# Patient Record
Sex: Male | Born: 1940 | Race: White | Hispanic: No | Marital: Married | Smoking: Former smoker
Health system: Southern US, Community
[De-identification: ages and names within clinical notes are randomized; demographics above are authoritative.]

## PROBLEM LIST (undated history)

## (undated) DIAGNOSIS — R001 Bradycardia, unspecified: Secondary | ICD-10-CM

## (undated) DIAGNOSIS — I509 Heart failure, unspecified: Secondary | ICD-10-CM

## (undated) DIAGNOSIS — H3530 Unspecified macular degeneration: Secondary | ICD-10-CM

## (undated) DIAGNOSIS — R413 Other amnesia: Secondary | ICD-10-CM

## (undated) DIAGNOSIS — R479 Unspecified speech disturbances: Secondary | ICD-10-CM

## (undated) DIAGNOSIS — R269 Unspecified abnormalities of gait and mobility: Secondary | ICD-10-CM

## (undated) HISTORY — PX: ROTATOR CUFF REPAIR: SHX139

## (undated) HISTORY — DX: Bradycardia, unspecified: R00.1

## (undated) HISTORY — PX: NOSE SURGERY: SHX723

## (undated) HISTORY — DX: Heart failure, unspecified: I50.9

## (undated) HISTORY — DX: Other amnesia: R41.3

## (undated) HISTORY — PX: OTHER SURGICAL HISTORY: SHX169

## (undated) HISTORY — DX: Unspecified abnormalities of gait and mobility: R26.9

## (undated) HISTORY — PX: FACIAL RECONSTRUCTION SURGERY: SHX631

## (undated) HISTORY — DX: Unspecified speech disturbances: R47.9

## (undated) HISTORY — DX: Unspecified macular degeneration: H35.30

---

## 2007-03-03 HISTORY — PX: CARDIOVASCULAR STRESS TEST: SHX262

## 2007-03-04 ENCOUNTER — Encounter: Admission: RE | Admit: 2007-03-04 | Discharge: 2007-03-04 | Payer: Self-pay | Admitting: Family Medicine

## 2007-03-11 ENCOUNTER — Inpatient Hospital Stay (HOSPITAL_BASED_OUTPATIENT_CLINIC_OR_DEPARTMENT_OTHER): Admission: RE | Admit: 2007-03-11 | Discharge: 2007-03-11 | Payer: Self-pay | Admitting: Cardiovascular Disease

## 2007-03-11 HISTORY — PX: CARDIAC CATHETERIZATION: SHX172

## 2008-12-20 HISTORY — PX: US ECHOCARDIOGRAPHY: HXRAD669

## 2009-12-29 ENCOUNTER — Ambulatory Visit: Payer: Self-pay | Admitting: Cardiovascular Disease

## 2010-08-07 NOTE — Cardiovascular Report (Signed)
NAMEDIRCK, BUTCH NO.:  [id number]   MEDICAL RECORD NO.:  [id number]          PATIENT TYPE:  OIB   LOCATION:  1962                         FACILITY:  [facility]   PHYSICIAN:  Vesta Mixer, M.D. DATE OF BIRTH:  November 22, 1940   DATE OF PROCEDURE:  03/11/2007  DATE OF DISCHARGE:                            CARDIAC CATHETERIZATION   Tommy Ross is an elderly gentleman with a history of bradycardia and  mild-to-moderate left ventricular dysfunction.  He was referred for  heart catheterization after having an abnormal Cardiolite study.   PROCEDURE:  Left heart catheterization with coronary angiography and  aortic aortography.   The right femoral artery was easily cannulated using modified Seldinger  technique.   HEMODYNAMICS:  The LV pressure is 107/11 with an aortic pressure of  106/79.   ANGIOGRAPHY:  Left main:  The left main is fairly smooth and normal.   The left anterior descending artery is a moderate-sized to large-sized  vessel.  It is smooth and normal throughout its course.  The first  diagonal artery is normal.   The left circumflex artery is relatively small.  It has a small first  obtuse marginal artery, and the circumflex and the marginals are normal.   The right coronary artery is a fairly large and dominant vessel.  It  gives off several small right ventricular branches and then has a  moderate-sized posterior descending artery and a moderate-sized  posterolateral artery, both of which are normal.   The left ventriculogram was performed in a 30 RAO position.  It reveals  a mildly dilated left ventricle.  Ejection fraction appears to be at the  lower limits of normal.   The aortogram was performed in a 30 LAO projection.  It reveals a trace  amount of aortic insufficiency.  There is mild-to-moderate aortic root  dilatation.   COMPLICATIONS:  None.   CONCLUSION:  1. Smooth and normal coronary arteries.  2. Low normal left ventricular  systolic function.  3. 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  4. Mild aortic insufficiency.   We will continue with medical therapy.           ______________________________  Vesta Mixer, M.D.     PJN/MEDQ  D:  03/11/2007  T:  03/11/2007  Job:  [id number]   cc:   Ursula Beath, MD

## 2010-08-07 NOTE — H&P (Signed)
NAMEOSBY, SWEETIN NO.:  [id number]   MEDICAL RECORD NO.:  [id number]           PATIENT TYPE:   LOCATION:                                 FACILITY:   PHYSICIAN:  Vesta Mixer, M.D. DATE OF BIRTH:  [birth year]   DATE OF ADMISSION:  03/11/2007  DATE OF DISCHARGE:                              HISTORY & PHYSICAL   Tommy Ross is a 70 year old gentleman with a history of bradycardia.  He is admitted now for heart catheterization after having an abnormal  stress Cardiolite study.   Tommy Ross has been a relatively healthy middle-aged gentleman.  He  exercises on a regular basis.  He has been noted to have bradycardia.  He has not noticed any episodes of chest pain or shortness of breath,  but has had some episodes of lightheadedness.  He, otherwise, does not  have any complaints.  He denies any real syncope or presyncope.  He  denies any PND or orthopnea.   He recently had a stress Cardiolite study during which it was very  difficult for him to get his heart rate up to his prescribed amount.  He  did not have any evidence of ischemia, but did have a moderately  depressed left ventricular systolic function with an ejection fraction  of 38%.  He is now referred for heart catheterization for further  evaluation of these problems.   CURRENT MEDICATIONS:  Multivitamin once a day, fiber once a day,  glucosamine once a day.   ALLERGIES:  None.   PAST MEDICAL HISTORY:  History of bradycardia, history of rotator cuff  surgery.   SOCIAL HISTORY:  The patient is a retired Financial controller at [employer].  He exercises  three times a week at the [facility].  He used to smoke, but quit in 1977.   FAMILY HISTORY:  His father died at age 49 due to suicide.  His mother  died at age 49 due to Alzheimer's disease.   REVIEW OF SYSTEMS:  Was reviewed and is essentially negative except for  as noted in the HPI.   PHYSICAL EXAMINATION:  GENERAL:  He is a middle-aged gentleman in no  acute  distress.  He is alert and oriented x3, and his mood and affect  are normal.  VITAL SIGNS:  Weight 224, blood pressure 134/80 with heart rate of 62.  NECK:  Reveals 2+ carotids.  He has no bruits, no JVD, no thyromegaly.  LUNGS:  Clear to auscultation.  HEART:  Regular rate, S1, S2.  ABDOMEN:  Reveals good bowel sounds and is nontender.  EXTREMITIES:  He has no clubbing, cyanosis or edema.  NEUROLOGICAL:  Nonfocal.   Tommy Ross presents with bradycardia and a moderately depressed left  ventricular systolic function.  I have recommended that we proceed with  heart catheterization for further evaluation.  We have discussed the  risks, benefits, and options of heart catheterization.  He understands  and agrees to proceed.  We will schedule it for December 17.           ______________________________  Vesta Mixer, M.D.  PJN/MEDQ  D:  03/04/2007  T:  03/05/2007  Job:  [id number]   cc:   Ursula Beath, MD

## 2010-10-08 ENCOUNTER — Other Ambulatory Visit: Payer: Self-pay | Admitting: Cardiology

## 2010-10-08 NOTE — Telephone Encounter (Signed)
Wrote on script needs yearly ov.

## 2011-01-15 ENCOUNTER — Other Ambulatory Visit: Payer: Self-pay | Admitting: Cardiovascular Disease

## 2011-01-15 NOTE — Telephone Encounter (Signed)
Fax Received. Refill Completed. Tommy Ross (M.A)  

## 2011-01-30 ENCOUNTER — Encounter: Payer: Self-pay | Admitting: Cardiovascular Disease

## 2011-02-01 ENCOUNTER — Encounter: Payer: Self-pay | Admitting: Cardiovascular Disease

## 2011-02-01 ENCOUNTER — Ambulatory Visit (INDEPENDENT_AMBULATORY_CARE_PROVIDER_SITE_OTHER): Payer: Medicare Other | Admitting: Cardiovascular Disease

## 2011-02-01 VITALS — BP 119/74 | HR 60 | Ht 70.5 in | Wt 236.8 lb

## 2011-02-01 DIAGNOSIS — I509 Heart failure, unspecified: Secondary | ICD-10-CM

## 2011-02-01 MED ORDER — LISINOPRIL 10 MG PO TABS: 10.0000 mg | ORAL_TABLET | Freq: Every day | ORAL | Status: DC

## 2011-02-01 NOTE — Progress Notes (Signed)
Tommy Ross Date of Birth  1940/10/13 Tommy Ross [address]    [address] [place], [place]  [address] [phone]  Fax  [phone]  History of Present Illness:  Tommy Ross is a 70 y.o. gentleman with a hx of CHF.  He remains quite active. He's not had any episodes of chest pain or shortness of breath.  Current Outpatient Prescriptions on File Prior to Visit  Medication Sig Dispense Refill  . colchicine 0.6 MG tablet Take 0.6 mg by mouth as needed.        Marland Kitchen FIBER PO Take by mouth daily.        Marland Kitchen lisinopril (PRINIVIL,ZESTRIL) 10 MG tablet TAKE 1 TABLET EVERY DAY  30 tablet  0  . Multiple Vitamin (MULTIVITAMIN) tablet Take 1 tablet by mouth daily.        . Multiple Vitamins-Minerals (EYE VITAMINS PO) Take by mouth daily.          No Known Allergies  Past Medical History  Diagnosis Date  . CHF (congestive heart failure)     EF 45-50%  . Bradycardia     Past Surgical History  Procedure Date  . Rotator cuff repair   . Nose surgery   . US echocardiography 12/20/2008    EF 45-50%  . Cardiovascular stress test 03/03/2007    EF 38%. NO EVIDENCE OF ISCHEMIA  . Cardiac catheterization 03/11/2007    REVEALS A MILDLY DILATED LEFT VENTRICLE. EF LOWER LIMITS OF NORMAL. SMOOTH AND NORMAL CORONARY ARTERIES. MILD AORTIC INSUFFICIENCY    History  Smoking status  . Former Smoker  . Quit date: 03/26/1975  Smokeless tobacco  . Not on file    History  Alcohol Use No    Family History  Problem Relation Age of Onset  . Alzheimer's disease Mother   . Suicidality Father   . Diabetes Father     Reviw of Systems:  Reviewed in the HPI.  All other systems are negative.  Physical Exam: BP 119/74  Pulse 60  Ht 5' 10.5" (1.791 m)  Wt 236 lb 12.8 oz (107.412 kg)  BMI 33.50 kg/m2 The patient is alert and oriented x 3.  The mood and affect are normal.   Skin: warm and dry.  Color is normal.    HEENT:   Normocephalic, atraumatic. He is no JVD. His carotids are  normal.  Lungs: His lungs are clear.   Heart: Regular rate S1-S2.  No murmurs    Abdomen: His abdomen is soft. He has good bowel sounds.  Extremities:  No clubbing cyanosis or edema.  Neuro:  Exam is nonfocal. His gait is normal.    ECG: Normal sinus rhythm. He has no ST or T wave changes.  Assessment / Plan:

## 2011-02-01 NOTE — Patient Instructions (Signed)
Your physician wants you to follow-up in: 1 year  You will receive a reminder letter in the mail two months in advance. If you don't receive a letter, please call our office to schedule the follow-up appointment.   Your physician recommends that you return for a FASTING lipid profile: 1 year   

## 2011-02-01 NOTE — Assessment & Plan Note (Signed)
Tommy Ross remains on ACE inhibitor. He is not on a beta blocker because of his bradycardia. Will continue the same medications. See him again in 1 year.

## 2012-01-28 ENCOUNTER — Other Ambulatory Visit: Payer: Self-pay | Admitting: *Deleted

## 2012-01-28 MED ORDER — LISINOPRIL 10 MG PO TABS: 10.0000 mg | ORAL_TABLET | Freq: Every day | ORAL | Status: DC

## 2012-01-28 NOTE — Telephone Encounter (Signed)
Pt needs appointment then refill can be made Fax Received. Refill Completed. Edelmira Gallogly Chowoe (R.M.A)   

## 2012-05-06 ENCOUNTER — Other Ambulatory Visit: Payer: Self-pay | Admitting: *Deleted

## 2012-05-06 MED ORDER — LISINOPRIL 10 MG PO TABS: 10.0000 mg | ORAL_TABLET | Freq: Every day | ORAL | Status: DC

## 2012-05-06 NOTE — Telephone Encounter (Signed)
Spoke to patient in reguard to filling his medication and explained to patient that our policy would like for patient to come in to see Dr. Elease Hashimoto at least once a year (last seen 2012). pati

## 2012-05-06 NOTE — Telephone Encounter (Signed)
NEED APPOINTMENT

## 2012-05-14 ENCOUNTER — Other Ambulatory Visit: Payer: Self-pay | Admitting: *Deleted

## 2012-05-14 NOTE — Telephone Encounter (Signed)
NEED APPOINTMENT Fax Received. Refill Completed. Louana Fontenot Chowoe (R.M.A)   

## 2012-06-04 ENCOUNTER — Other Ambulatory Visit: Payer: Self-pay | Admitting: *Deleted

## 2012-06-04 MED ORDER — LISINOPRIL 10 MG PO TABS: 10.0000 mg | ORAL_TABLET | Freq: Every day | ORAL | Status: DC

## 2012-06-04 NOTE — Telephone Encounter (Signed)
Fax Received. Refill Completed. Tommy Ross (R.M.A)  No refills until appointment

## 2012-07-27 ENCOUNTER — Encounter: Payer: Self-pay | Admitting: Cardiovascular Disease

## 2012-08-12 ENCOUNTER — Ambulatory Visit (INDEPENDENT_AMBULATORY_CARE_PROVIDER_SITE_OTHER): Payer: Medicare Other | Admitting: Cardiovascular Disease

## 2012-08-12 ENCOUNTER — Encounter: Payer: Self-pay | Admitting: Cardiovascular Disease

## 2012-08-12 VITALS — BP 134/76 | HR 59 | Ht 70.5 in | Wt 236.8 lb

## 2012-08-12 DIAGNOSIS — I509 Heart failure, unspecified: Secondary | ICD-10-CM

## 2012-08-12 LAB — BASIC METABOLIC PANEL
Chloride: 110 mEq/L (ref 96–112)
Creatinine, Ser: 1.3 mg/dL (ref 0.4–1.5)
GFR: 58.92 mL/min — ABNORMAL LOW (ref >60.00)
Glucose, Bld: 86 mg/dL (ref 70–99)

## 2012-08-12 LAB — HEPATIC FUNCTION PANEL
Alkaline Phosphatase: 85 U/L (ref 39–117)
Bilirubin, Direct: 0 mg/dL (ref 0.0–0.3)
Total Bilirubin: 0.8 mg/dL (ref 0.3–1.2)
Total Protein: 6.9 g/dL (ref 6.0–8.3)

## 2012-08-12 LAB — LIPID PANEL
Cholesterol: 211 mg/dL — ABNORMAL HIGH (ref 0–200)
Total CHOL/HDL Ratio: 5
Triglycerides: 199 mg/dL — ABNORMAL HIGH (ref 0.0–149.0)
VLDL: 39.8 mg/dL (ref 0.0–40.0)

## 2012-08-12 LAB — LDL CHOLESTEROL, DIRECT: Direct LDL: 141.6 mg/dL

## 2012-08-12 MED ORDER — LISINOPRIL 10 MG PO TABS: 10.0000 mg | ORAL_TABLET | Freq: Every day | ORAL | Status: DC

## 2012-08-12 NOTE — Progress Notes (Signed)
Tommy Ross Date of Birth  11-21-1940 [facility] [address]    [address] [place], [place]  [address] [phone]  Fax  [phone]  History of Present Illness:  Tommy Ross is a 72 y.o. gentleman with a hx of CHF.  He remains quite active. He's not had any episodes of chest pain or shortness of breath.  Aug 12, 2012:  Tommy Ross is doing great.  He is exercising regularly.  Cutting lots of firewood.  [facility]    Current Outpatient Prescriptions on File Prior to Visit  Medication Sig Dispense Refill  . colchicine 0.6 MG tablet Take 0.6 mg by mouth as needed.        Marland Kitchen FIBER PO Take by mouth daily.        Marland Kitchen lisinopril (PRINIVIL,ZESTRIL) 10 MG tablet Take 1 tablet (10 mg total) by mouth daily.  30 tablet  2  . Multiple Vitamin (MULTIVITAMIN) tablet Take 1 tablet by mouth daily.        . Multiple Vitamins-Minerals (EYE VITAMINS PO) Take by mouth daily.        . NON FORMULARY Black Cherry Extract        No current facility-administered medications on file prior to visit.    No Known Allergies  Past Medical History  Diagnosis Date  . CHF (congestive heart failure)     EF 45-50%  . Bradycardia     Past Surgical History  Procedure Laterality Date  . Rotator cuff repair    . Nose surgery    . US echocardiography  12/20/2008    EF 45-50%  . Cardiovascular stress test  03/03/2007    EF 38%. NO EVIDENCE OF ISCHEMIA  . Cardiac catheterization  03/11/2007    REVEALS A MILDLY DILATED LEFT VENTRICLE. EF LOWER LIMITS OF NORMAL. SMOOTH AND NORMAL CORONARY ARTERIES. MILD AORTIC INSUFFICIENCY    History  Smoking status  . Former Smoker  . Quit date: 03/26/1975  Smokeless tobacco  . Not on file    History  Alcohol Use No    Family History  Problem Relation Age of Onset  . Alzheimer's disease Mother   . Suicidality Father   . Diabetes Father     Reviw of Systems:  Reviewed in the HPI.  All other systems are negative.  Physical Exam: BP  134/76  Pulse 59  Ht 5' 10.5" (1.791 m)  Wt 236 lb 12.8 oz (107.412 kg)  BMI 33.49 kg/m2  SpO2 94% The patient is alert and oriented x 3.  The mood and affect are normal.   Skin: warm and dry.  Color is normal.    HEENT:   Normocephalic, atraumatic. He is no JVD. His carotids are normal.  Lungs: His lungs are clear.   Heart: Regular rate S1-S2.  No murmurs    Abdomen: His abdomen is soft. He has good bowel sounds.  Extremities:  No clubbing cyanosis or edema.  Neuro:  Exam is nonfocal. His gait is normal.    ECG: Normal sinus rhythm. He has no ST or T wave changes.  Assessment / Plan:

## 2012-08-12 NOTE — Patient Instructions (Addendum)
Your physician wants you to follow-up in: 1 year with EKG  You will receive a reminder letter in the mail two months in advance. If you don't receive a letter, please call our office to schedule the follow-up appointment.  Your physician recommends that you return for lab work in: today bmet lipid hepatic  RESTART LISINOPRIL, SCRIPT SENT TO PHARMACY

## 2012-08-12 NOTE — Assessment & Plan Note (Addendum)
Tommy Ross is doing well.   He ran out of his lisinopril recently. He seems to be doing well. He is avoiding salt for the most part.  I will see him  again in one year for followup office visit.

## 2013-07-24 ENCOUNTER — Other Ambulatory Visit: Payer: Self-pay | Admitting: Cardiovascular Disease

## 2014-01-25 ENCOUNTER — Other Ambulatory Visit: Payer: Self-pay | Admitting: Cardiovascular Disease

## 2014-01-27 ENCOUNTER — Other Ambulatory Visit: Payer: Self-pay | Admitting: Cardiovascular Disease

## 2014-02-26 ENCOUNTER — Other Ambulatory Visit: Payer: Self-pay | Admitting: Cardiovascular Disease

## 2014-02-26 NOTE — Telephone Encounter (Signed)
Rx was sent to pharmacy electronically. 

## 2014-03-11 ENCOUNTER — Other Ambulatory Visit: Payer: Self-pay | Admitting: Cardiovascular Disease

## 2014-03-18 ENCOUNTER — Other Ambulatory Visit: Payer: Self-pay | Admitting: Cardiovascular Disease

## 2014-03-21 NOTE — Telephone Encounter (Signed)
May refill. Please make him an apt.

## 2014-03-21 NOTE — Telephone Encounter (Signed)
This is Nahser's patient not mine

## 2014-03-22 ENCOUNTER — Other Ambulatory Visit: Payer: Self-pay

## 2014-03-22 MED ORDER — LISINOPRIL 10 MG PO TABS: ORAL_TABLET | ORAL | Status: DC

## 2014-06-28 ENCOUNTER — Emergency Department (HOSPITAL_COMMUNITY)
Admission: EM | Admit: 2014-06-28 | Discharge: 2014-06-28 | Disposition: A | Discharge status: Home / Self Care | Payer: Medicare Other | Attending: Emergency Medicine | Admitting: Emergency Medicine

## 2014-06-28 ENCOUNTER — Encounter (HOSPITAL_COMMUNITY): Payer: Self-pay | Admitting: Emergency Medicine

## 2014-06-28 ENCOUNTER — Emergency Department (HOSPITAL_COMMUNITY): Payer: Medicare Other

## 2014-06-28 DIAGNOSIS — Y92009 Unspecified place in unspecified non-institutional (private) residence as the place of occurrence of the external cause: Secondary | ICD-10-CM | POA: Insufficient documentation

## 2014-06-28 DIAGNOSIS — Z23 Encounter for immunization: Secondary | ICD-10-CM | POA: Insufficient documentation

## 2014-06-28 DIAGNOSIS — J342 Deviated nasal septum: Secondary | ICD-10-CM

## 2014-06-28 DIAGNOSIS — S022XXB Fracture of nasal bones, initial encounter for open fracture: Secondary | ICD-10-CM | POA: Insufficient documentation

## 2014-06-28 DIAGNOSIS — Y998 Other external cause status: Secondary | ICD-10-CM | POA: Diagnosis not present

## 2014-06-28 DIAGNOSIS — S0121XA Laceration without foreign body of nose, initial encounter: Secondary | ICD-10-CM | POA: Insufficient documentation

## 2014-06-28 DIAGNOSIS — S0181XA Laceration without foreign body of other part of head, initial encounter: Secondary | ICD-10-CM

## 2014-06-28 DIAGNOSIS — W010XXA Fall on same level from slipping, tripping and stumbling without subsequent striking against object, initial encounter: Secondary | ICD-10-CM | POA: Insufficient documentation

## 2014-06-28 DIAGNOSIS — W19XXXA Unspecified fall, initial encounter: Secondary | ICD-10-CM

## 2014-06-28 DIAGNOSIS — Y9389 Activity, other specified: Secondary | ICD-10-CM | POA: Insufficient documentation

## 2014-06-28 DIAGNOSIS — S0992XA Unspecified injury of nose, initial encounter: Secondary | ICD-10-CM | POA: Diagnosis present

## 2014-06-28 MED ORDER — HYDROCODONE-ACETAMINOPHEN 5-325 MG PO TABS: ORAL_TABLET | ORAL | Status: DC

## 2014-06-28 MED ORDER — CEFAZOLIN SODIUM 1-5 GM-% IV SOLN
1.0000 g | Freq: Once | INTRAVENOUS | Status: DC
  Filled 2014-06-28: qty 50

## 2014-06-28 MED ORDER — BACITRACIN 500 UNIT/GM EX OINT
3.0000 "application " | TOPICAL_OINTMENT | Freq: Once | CUTANEOUS | Status: AC
  Administered 2014-06-28: 3 via TOPICAL
  Filled 2014-06-28: qty 2.7

## 2014-06-28 MED ORDER — TETANUS-DIPHTH-ACELL PERTUSSIS 5-2.5-18.5 LF-MCG/0.5 IM SUSP
0.5000 mL | Freq: Once | INTRAMUSCULAR | Status: AC
  Administered 2014-06-28: 0.5 mL via INTRAMUSCULAR
  Filled 2014-06-28: qty 0.5

## 2014-06-28 MED ORDER — LIDOCAINE-EPINEPHRINE-TETRACAINE (LET) SOLUTION
3.0000 mL | Freq: Once | NASAL | Status: AC
  Administered 2014-06-28: 3 mL via TOPICAL
  Filled 2014-06-28: qty 3

## 2014-06-28 MED ORDER — AMOXICILLIN-POT CLAVULANATE 875-125 MG PO TABS
1.0000 | ORAL_TABLET | Freq: Once | ORAL | Status: AC
  Administered 2014-06-28: 1 via ORAL
  Filled 2014-06-28: qty 1

## 2014-06-28 MED ORDER — LIDOCAINE HCL (PF) 1 % IJ SOLN
5.0000 mL | Freq: Once | INTRAMUSCULAR | Status: AC
  Administered 2014-06-28: 5 mL
  Filled 2014-06-28: qty 5

## 2014-06-28 MED ORDER — AMOXICILLIN-POT CLAVULANATE 875-125 MG PO TABS: 1.0000 | ORAL_TABLET | Freq: Two times a day (BID) | ORAL | Status: DC

## 2014-06-28 NOTE — ED Notes (Signed)
Pt st's he tripped over a root in the yard and fell.  Pt has deep lac to bridge of nose.  Also lac to right forehead with lg hematoma

## 2014-06-28 NOTE — ED Provider Notes (Signed)
CSN: [id number]     Arrival date & time 06/28/14  1711 History  This chart was scribed for non-physician practitioner, Wynetta EmeryNicole Tajuanna Burnett, PA-C, working with Gilda Creasehristopher J Pollina, MD, by Bronson CurbJacqueline Melvin, ED Scribe. This patient was seen in room TR02C/TR02C and the patient's care was started at 6:05 PM.    Chief Complaint  Patient presents with  . Facial Laceration     The history is provided by the patient. No language interpreter was used.     HPI Comments: Tommy Ross is a 74 y.o. male who presents to the Emergency Department complaining of facial lacerations sustained today. Patient was gardening when he tripped over a root and fell forward onto the ground. There is a laceration to the nasal bridge and an additional laceration on the right forehead. He denies anticoagulation, LOC, changes in vision, vomiting, neck pain, weakness, chest pain, abdominal pain, or abnormal gait. Bleeding is easy to control. He was seen at Urgent Care and sent to the ED for further evaluation. Last tetanus unknown. Patient has history of remote facial trauma that was managed by Dr. Lazarus SalinesWolicki   Past Medical History  Diagnosis Date  . CHF (congestive heart failure)     EF 45-50%  . Bradycardia    Past Surgical History  Procedure Laterality Date  . Rotator cuff repair    . Nose surgery    . [place] echocardiography  12/20/2008    EF 45-50%  . Cardiovascular stress test  03/03/2007    EF 38%. NO EVIDENCE OF ISCHEMIA  . Cardiac catheterization  03/11/2007    REVEALS A MILDLY DILATED LEFT VENTRICLE. EF LOWER LIMITS OF NORMAL. SMOOTH AND NORMAL CORONARY ARTERIES. MILD AORTIC INSUFFICIENCY   Family History  Problem Relation Age of Onset  . Alzheimer's disease Mother   . Suicidality Father   . Diabetes Father    History  Substance Use Topics  . Smoking status: Former Smoker    Quit date: 03/26/1975  . Smokeless tobacco: Not on file  . Alcohol Use: No    Review of Systems  A complete 10 system review of  systems was obtained and all systems are negative except as noted in the HPI and PMH.    Allergies  Review of patient's allergies indicates no known allergies.  Home Medications   Prior to Admission medications   Medication Sig Start Date End Date Taking? Authorizing Provider  ACETAMINOPHEN PO Take by mouth daily.    Historical Provider, MD  colchicine 0.6 MG tablet Take 0.6 mg by mouth as needed.      Historical Provider, MD  FIBER PO Take by mouth daily.      Historical Provider, MD  GLUCOSAMINE CHONDROITIN COMPLX PO Take by mouth daily.    Historical Provider, MD  lisinopril (PRINIVIL,ZESTRIL) 10 MG tablet TAKE 1 TABLET (10 MG TOTAL) BY MOUTH DAILY. <PLEASE MAKE AN APPOINTMENT FOR FUTURE REFILLS> 03/22/14   Vesta MixerPhilip J Nahser, MD  Multiple Vitamin (MULTIVITAMIN) tablet Take 1 tablet by mouth daily.      Historical Provider, MD  Multiple Vitamins-Minerals (EYE VITAMINS PO) Take by mouth daily.      Historical Provider, MD  NON FORMULARY Black Cherry Extract     Historical Provider, MD   Triage Vitals: BP 137/75 mmHg  Pulse 58  Temp(Src) 98 F (36.7 C) (Oral)  Resp 20  Ht 5\' 11"  (1.803 m)  Wt 230 lb (104.327 kg)  BMI 32.09 kg/m2  SpO2 96%  Physical Exam  Constitutional: He is oriented  to person, place, and time. He appears well-developed and well-nourished. No distress.  HENT:  Head: Normocephalic.  Mouth/Throat: Oropharynx is clear and moist.  Patient has partial-thickness abrasions to forehead and right zygoma.   2 cm irregular laceration that is full-thickness over the nasal bridge, there is significant swelling and tenderness. Scant venous bleeding, no contamination of the wound.   There is blood in the right nares, there is no septal hematoma, septum appears deviated to the left.  Extraocular movement is intact without pain or diplopia, there is no crepitance or tenderness to palpation along the orbital rims. There is no intraoral trauma, loose or painful teeth.  No  hemotympanum, battle signs or raccoon's eyes  No abnormal otorrhea  Eyes: Conjunctivae and EOM are normal. Pupils are equal, round, and reactive to light.  Neck: Normal range of motion. Neck supple. No tracheal deviation present.  No midline C-spine  tenderness to palpation or step-offs appreciated. Patient has full range of motion without pain.   Cardiovascular: Normal rate, regular rhythm and intact distal pulses.   Pulmonary/Chest: Effort normal and breath sounds normal. No respiratory distress. He has no wheezes. He has no rales. He exhibits no tenderness.  No  TTP or crepitance  Abdominal: Soft. Bowel sounds are normal. He exhibits no distension and no mass. There is no tenderness. There is no rebound and no guarding.  Musculoskeletal: Normal range of motion. He exhibits no edema or tenderness.  Pelvis stable. No deformity or TTP of major joints.   Good ROM  Neurological: He is alert and oriented to person, place, and time.  Follows commands, Clear, goal oriented speech, Strength is 5 out of 5x4 extremities, patient ambulates with a coordinated in nonantalgic gait. Sensation is grossly intact.   Skin: Skin is warm and dry.  Psychiatric: He has a normal mood and affect. His behavior is normal.  Nursing note and vitals reviewed.   ED Course  LACERATION REPAIR Date/Time: 06/29/2014 1:10 AM Performed by: Wynetta Emery Authorized by: Wynetta Emery Consent: Verbal consent obtained. Consent given by: patient Patient identity confirmed: verbally with patient Body area: head/neck Laceration length: 2 cm Foreign bodies: no foreign bodies Anesthesia: local infiltration Local anesthetic: lidocaine 1% without epinephrine and LET (lido,epi,tetracaine) Anesthetic total: 4 ml Patient sedated: no Preparation: Patient was prepped and draped in the usual sterile fashion. Irrigation solution: saline Irrigation method: syringe Amount of cleaning: extensive Debridement: minimal Degree  of undermining: none Skin closure: Ethilon (5-0) Number of sutures: 4 Technique: simple Approximation: close Approximation difficulty: simple Dressing: antibiotic ointment Patient tolerance: Patient tolerated the procedure well with no immediate complications   (including critical care time)  DIAGNOSTIC STUDIES: Oxygen Saturation is 96% on room air, adequate by my interpretation.    COORDINATION OF CARE: At 1805 Discussed treatment plan with patient which includes imaging. Patient agrees.    Labs Review Labs Reviewed - No data to display  Imaging Review Ct Head Wo Contrast  06/28/2014   CLINICAL DATA:  Fall over tree root with facial injury and laceration. Frontal bone and nasal pain.  EXAM: CT HEAD WITHOUT CONTRAST  CT MAXILLOFACIAL WITHOUT CONTRAST  CT CERVICAL SPINE WITHOUT CONTRAST  TECHNIQUE: Multidetector CT imaging of the head, cervical spine, and maxillofacial structures were performed using the standard protocol without intravenous contrast. Multiplanar CT image reconstructions of the cervical spine and maxillofacial structures were also generated.  COMPARISON:  None.  FINDINGS: CT HEAD FINDINGS  Skull and Sinuses:Facial findings are described below. No acute calvarial injury.  Orbits: No acute abnormality.  Brain: No evidence of acute infarction, hemorrhage, hydrocephalus, or mass lesion/mass effect. Marked brain atrophy, especially prominent to the cerebellum.  CT MAXILLOFACIAL FINDINGS  Soft tissue swelling and lacerations over the nasal bridge. There is expansion and fat haziness over the frontal bone as well. Although this could be partially postsurgical, given contiguous contusion and laceration it is considered an acute forehead contusion  Sclerosis and chronic appearing fracture lines to the bilateral nasal arch, where there is also sutures. There is superimposed acute injury with a fracture of the right nasal bone that is depressed. The anterior nasal septum is buckled with  small volume mucosal emphysema, compatible with acute fracture and laceration. No related nasal cavity narrowing.  Status post frontal sinus obliteration. There is no expansion suggestive of a mucocele. Inflammatory mucosal thickening extends into the frontal ethmoidal recesses. The mandible is intact and located. No acute findings in the orbits.  CT CERVICAL SPINE FINDINGS  Negative for cervical spine fracture. No suspected traumatic malalignment. Slight anterolisthesis at C2-3 is associated with advanced right facet arthropathy, the most likely explanation. Degenerative disc disease with focally advanced disc narrowing at C3-4 and C4-5. No gross cervical canal hematoma or prevertebral edema.  IMPRESSION: 1. No evidence of acute intracranial injury. 2. 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 There is overlying laceration concerning for open injury. 3. Minimally displaced nasal septum fracture with mucosal laceration. 4. Negative for cervical spine fracture. 5. Cerebellar predominant brain atrophy.   Electronically Signed   By: Marnee Spring M.D.   On: 06/28/2014 20:11   Ct Cervical Spine Wo Contrast  06/28/2014   CLINICAL DATA:  Fall over tree root with facial injury and laceration. Frontal bone and nasal pain.  EXAM: CT HEAD WITHOUT CONTRAST  CT MAXILLOFACIAL WITHOUT CONTRAST  CT CERVICAL SPINE WITHOUT CONTRAST  TECHNIQUE: Multidetector CT imaging of the head, cervical spine, and maxillofacial structures were performed using the standard protocol without intravenous contrast. Multiplanar CT image reconstructions of the cervical spine and maxillofacial structures were also generated.  COMPARISON:  None.  FINDINGS: CT HEAD FINDINGS  Skull and Sinuses:Facial findings are described below. No acute calvarial injury.  Orbits: No acute abnormality.  Brain: No evidence of acute infarction, hemorrhage, hydrocephalus, or mass lesion/mass effect. Marked brain atrophy, especially prominent to the  cerebellum.  CT MAXILLOFACIAL FINDINGS  Soft tissue swelling and lacerations over the nasal bridge. There is expansion and fat haziness over the frontal bone as well. Although this could be partially postsurgical, given contiguous contusion and laceration it is considered an acute forehead contusion  Sclerosis and chronic appearing fracture lines to the bilateral nasal arch, where there is also sutures. There is superimposed acute injury with a fracture of the right nasal bone that is depressed. The anterior nasal septum is buckled with small volume mucosal emphysema, compatible with acute fracture and laceration. No related nasal cavity narrowing.  Status post frontal sinus obliteration. There is no expansion suggestive of a mucocele. Inflammatory mucosal thickening extends into the frontal ethmoidal recesses. The mandible is intact and located. No acute findings in the orbits.  CT CERVICAL SPINE FINDINGS  Negative for cervical spine fracture. No suspected traumatic malalignment. Slight anterolisthesis at C2-3 is associated with advanced right facet arthropathy, the most likely explanation. Degenerative disc disease with focally advanced disc narrowing at C3-4 and C4-5. No gross cervical canal hematoma or prevertebral edema.  IMPRESSION: 1. No evidence of acute intracranial injury. 2. Acute on chronic nasal arch  fractures with depression. There is overlying laceration concerning for open injury. 3. Minimally displaced nasal septum fracture with mucosal laceration. 4. Negative for cervical spine fracture. 5. Cerebellar predominant brain atrophy.   Electronically Signed   By: Marnee Spring M.D.   On: 06/28/2014 20:11   Ct Maxillofacial Wo Cm  06/28/2014   CLINICAL DATA:  Fall over tree root with facial injury and laceration. Frontal bone and nasal pain.  EXAM: CT HEAD WITHOUT CONTRAST  CT MAXILLOFACIAL WITHOUT CONTRAST  CT CERVICAL SPINE WITHOUT CONTRAST  TECHNIQUE: Multidetector CT imaging of the head, cervical  spine, and maxillofacial structures were performed using the standard protocol without intravenous contrast. Multiplanar CT image reconstructions of the cervical spine and maxillofacial structures were also generated.  COMPARISON:  None.  FINDINGS: CT HEAD FINDINGS  Skull and Sinuses:Facial findings are described below. No acute calvarial injury.  Orbits: No acute abnormality.  Brain: No evidence of acute infarction, hemorrhage, hydrocephalus, or mass lesion/mass effect. Marked brain atrophy, especially prominent to the cerebellum.  CT MAXILLOFACIAL FINDINGS  Soft tissue swelling and lacerations over the nasal bridge. There is expansion and fat haziness over the frontal bone as well. Although this could be partially postsurgical, given contiguous contusion and laceration it is considered an acute forehead contusion  Sclerosis and chronic appearing fracture lines to the bilateral nasal arch, where there is also sutures. There is superimposed acute injury with a fracture of the right nasal bone that is depressed. The anterior nasal septum is buckled with small volume mucosal emphysema, compatible with acute fracture and laceration. No related nasal cavity narrowing.  Status post frontal sinus obliteration. There is no expansion suggestive of a mucocele. Inflammatory mucosal thickening extends into the frontal ethmoidal recesses. The mandible is intact and located. No acute findings in the orbits.  CT CERVICAL SPINE FINDINGS  Negative for cervical spine fracture. No suspected traumatic malalignment. Slight anterolisthesis at C2-3 is associated with advanced right facet arthropathy, the most likely explanation. Degenerative disc disease with focally advanced disc narrowing at C3-4 and C4-5. No gross cervical canal hematoma or prevertebral edema.  IMPRESSION: 1. No evidence of acute intracranial injury. 2. 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 There is overlying laceration concerning for open injury. 3.  Minimally displaced nasal septum fracture with mucosal laceration. 4. Negative for cervical spine fracture. 5. Cerebellar predominant brain atrophy.   Electronically Signed   By: Marnee Spring M.D.   On: 06/28/2014 20:11     EKG Interpretation None      MDM   Final diagnoses:  Face lacerations, initial encounter  Nasal bone fracture, open, initial encounter  Deviated nasal septum  Fall at home, initial encounter    MDM Number of Diagnoses or Management Options Deviated nasal septum:  Face lacerations, initial encounter:  Fall at home, initial encounter:  Nasal bone fracture, open, initial encounter:    Filed Vitals:   06/28/14 1755 06/28/14 2123  BP: 137/75 137/69  Pulse: 58 65  Temp: 98 F (36.7 C) 98.2 F (36.8 C)  TempSrc: Oral   Resp: 20 20  Height:  (1.803 m)   Weight: 230 lb (104.327 kg)   SpO2: 96% 95%    Medications  Tdap (BOOSTRIX) injection 0.5 mL (0.5 mLs Intramuscular Given 06/28/14 1815)  lidocaine (PF) (XYLOCAINE) 1 % injection 5 mL (5 mLs Other Given 06/28/14 1828)  lidocaine-EPINEPHrine-tetracaine (LET) solution (3 mLs Topical Given 06/28/14 1817)  bacitracin ointment 3 application (3 application Topical Given 06/28/14 2113)  amoxicillin-clavulanate (AUGMENTIN) 875-125 MG per tablet 1 tablet (1 tablet Oral Given 06/28/14 2122)    Stepan Verrette is a pleasant 74 y.o. male presenting with laceration and facial trauma status post mechanical slip and fall. Nasal bridge is extremely swollen, no signs of orbital fracture or entrapment. Considering his age head CT, maxillofacial CT and C-spine CT are ordered. Maxillofacial CT shows acute on chronic nasal arch fractures with depression overlying laceration, minimally displaced nasal septum and fracture with mucosal laceration.  Maxillofacial consult from Dr. Suszanne Conners appreciated: States that IV Ancef is not necessary, he recommends Augmentin. He states it is appropriate to close the wound in the ED. He will evaluate  the patient in the office at the end of the week after the swelling has gone down. Discussed wound care and return precautions.   Discussed case with attending MD who agrees with plan and stability to d/c to home.   Evaluation does not show pathology that would require ongoing emergent intervention or inpatient treatment. Pt is hemodynamically stable and mentating appropriately. Discussed findings and plan with patient/guardian, who agrees with care plan. All questions answered. Return precautions discussed and outpatient follow up given.    Wynetta Emery, PA-C 06/29/14 0115  Gilda Crease, MD 07/01/14 [phone]

## 2014-06-28 NOTE — Discharge Instructions (Signed)
Rest, Ice intermittently (in the first 24-48 hours), Gentle compression with an Ace wrap, and elevate (Limb above the level of the heart)   Take vicodin for breakthrough pain, do not drink alcohol, drive, care for children or do other critical tasks while taking vicodin.  Keep wound dry and do not remove dressing for 24 hours if possible. After that, wash gently morning and night (every 12 hours) with soap and water. Use a topical antibiotic ointment and cover with a bandaid or gauze.    Do NOT use rubbing alcohol or hydrogen peroxide, do not soak the area   Present to your primary care doctor or the urgent care of your choice, or the ED for suture removal in 7-10 days.   Every attempt was made to remove foreign body (contaminants) from the wound.  However, there is always a chance that some may remain in the wound. This can  increase your risk of infection.   If you see signs of infection (warmth, redness, tenderness, pus, sharp increase in pain, fever, red streaking in the skin) immediately return to the emergency department.   After the wound heals fully, apply sunscreen for 6-12 months to minimize scarring.    Facial Fracture A facial fracture is a break in one of the bones of your face. HOME CARE INSTRUCTIONS   Protect the injured part of your face until it is healed.  Do not participate in activities which give chance for re-injury until your doctor approves.  Gently wash and dry your face.  Wear head and facial protection while riding a bicycle, motorcycle, or snowmobile. SEEK MEDICAL CARE IF:   An oral temperature above 102 F (38.9 C) develops.  You have severe headaches or notice changes in your vision.  You have new numbness or tingling in your face.  You develop nausea (feeling sick to your stomach), vomiting or a stiff neck. SEEK IMMEDIATE MEDICAL CARE IF:   You develop difficulty seeing or experience double vision.  You become dizzy, lightheaded, or  faint.  You develop trouble speaking, breathing, or swallowing.  You have a watery discharge from your nose or ear. MAKE SURE YOU:   Understand these instructions.  Will watch your condition.  Will get help right away if you are not doing well or get worse. Document Released: 03/11/2005 Document Revised: 06/03/2011 Document Reviewed: 10/29/2007 [facility] Patient Information 2015 [place], [place]. This information is not intended to replace advice given to you by your health care provider. Make sure you discuss any questions you have with your health care provider.  Facial Laceration  A facial laceration is a cut on the face. These injuries can be painful and cause bleeding. Lacerations usually heal quickly, but they need special care to reduce scarring. DIAGNOSIS  Your health care provider will take a medical history, ask for details about how the injury occurred, and examine the wound to determine how deep the cut is. TREATMENT  Some facial lacerations may not require closure. Others may not be able to be closed because of an increased risk of infection. The risk of infection and the chance for successful closure will depend on various factors, including the amount of time since the injury occurred. The wound may be cleaned to help prevent infection. If closure is appropriate, pain medicines may be given if needed. Your health care provider will use stitches (sutures), wound glue (adhesive), or skin adhesive strips to repair the laceration. These tools bring the skin edges together to allow for faster  healing and a better cosmetic outcome. If needed, you may also be given a tetanus shot. HOME CARE INSTRUCTIONS  Only take over-the-counter or prescription medicines as directed by your health care provider.  Follow your health care provider's instructions for wound care. These instructions will vary depending on the technique used for closing the wound. For Sutures:  Keep the wound clean and  dry.   If you were given a bandage (dressing), you should change it at least once a day. Also change the dressing if it becomes wet or dirty, or as directed by your health care provider.   Wash the wound with soap and water 2 times a day. Rinse the wound off with water to remove all soap. Pat the wound dry with a clean towel.   After cleaning, apply a thin layer of the antibiotic ointment recommended by your health care provider. This will help prevent infection and keep the dressing from sticking.   You may shower as usual after the first 24 hours. Do not soak the wound in water until the sutures are removed.   Get your sutures removed as directed by your health care provider. With facial lacerations, sutures should usually be taken out after 4-5 days to avoid stitch marks.   Wait a few days after your sutures are removed before applying any makeup. For Skin Adhesive Strips:  Keep the wound clean and dry.   Do not get the skin adhesive strips wet. You may bathe carefully, using caution to keep the wound dry.   If the wound gets wet, pat it dry with a clean towel.   Skin adhesive strips will fall off on their own. You may trim the strips as the wound heals. Do not remove skin adhesive strips that are still stuck to the wound. They will fall off in time.  For Wound Adhesive:  You may briefly wet your wound in the shower or bath. Do not soak or scrub the wound. Do not swim. Avoid periods of heavy sweating until the skin adhesive has fallen off on its own. After showering or bathing, gently pat the wound dry with a clean towel.   Do not apply liquid medicine, cream medicine, ointment medicine, or makeup to your wound while the skin adhesive is in place. This may loosen the film before your wound is healed.   If a dressing is placed over the wound, be careful not to apply tape directly over the skin adhesive. This may cause the adhesive to be pulled off before the wound is healed.    Avoid prolonged exposure to sunlight or tanning lamps while the skin adhesive is in place.  The skin adhesive will usually remain in place for 5-10 days, then naturally fall off the skin. Do not pick at the adhesive film.  After Healing: Once the wound has healed, cover the wound with sunscreen during the day for 1 full year. This can help minimize scarring. Exposure to ultraviolet light in the first year will darken the scar. It can take 1-2 years for the scar to lose its redness and to heal completely.  SEEK IMMEDIATE MEDICAL CARE IF:  You have redness, pain, or swelling around the wound.   You see ayellowish-white fluid (pus) coming from the wound.   You have chills or a fever.  MAKE SURE YOU:  Understand these instructions.  Will watch your condition.  Will get help right away if you are not doing well or get worse. Document Released: 04/18/2004 Document  Revised: 12/30/2012 Document Reviewed: 10/22/2012 [facility] Patient Information 2015 [place], [place]. This information is not intended to replace advice given to you by your health care provider. Make sure you discuss any questions you have with your health care provider.

## 2020-04-18 ENCOUNTER — Ambulatory Visit: Payer: Medicare Other | Admitting: Neurology

## 2020-06-19 ENCOUNTER — Other Ambulatory Visit: Payer: Self-pay

## 2020-06-19 ENCOUNTER — Ambulatory Visit: Payer: Medicare Other | Admitting: Neurology

## 2020-06-19 ENCOUNTER — Encounter: Payer: Self-pay | Admitting: Neurology

## 2020-06-19 VITALS — BP 132/82 | HR 67

## 2020-06-19 DIAGNOSIS — G3184 Mild cognitive impairment, so stated: Secondary | ICD-10-CM | POA: Insufficient documentation

## 2020-06-19 DIAGNOSIS — R270 Ataxia, unspecified: Secondary | ICD-10-CM

## 2020-06-19 DIAGNOSIS — R269 Unspecified abnormalities of gait and mobility: Secondary | ICD-10-CM | POA: Diagnosis not present

## 2020-06-19 MED ORDER — DIVALPROEX SODIUM ER 500 MG PO TB24: 500.0000 mg | ORAL_TABLET | Freq: Every day | ORAL | 11 refills | Status: AC
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
## 2020-06-20 ENCOUNTER — Telehealth: Payer: Self-pay | Admitting: Neurology

## 2020-06-20 ENCOUNTER — Telehealth: Payer: Self-pay | Admitting: *Deleted

## 2020-06-20 LAB — CBC WITH DIFFERENTIAL/PLATELET
Basophils Absolute: 0 10*3/uL (ref 0.0–0.2)
Basos: 0 %
EOS (ABSOLUTE): 0.1 10*3/uL (ref 0.0–0.4)
Eos: 2 %
Hematocrit: 44.1 % (ref 37.5–51.0)
Hemoglobin: 14.9 g/dL (ref 13.0–17.7)
Immature Grans (Abs): 0 10*3/uL (ref 0.0–0.1)
Immature Granulocytes: 0 %
Lymphocytes Absolute: 2 10*3/uL (ref 0.7–3.1)
Lymphs: 26 %
MCH: 28.7 pg (ref 26.6–33.0)
MCHC: 33.8 g/dL (ref 31.5–35.7)
MCV: 85 fL (ref 79–97)
Monocytes Absolute: 0.8 10*3/uL (ref 0.1–0.9)
Monocytes: 11 %
Neutrophils Absolute: 4.7 10*3/uL (ref 1.4–7.0)
Neutrophils: 61 %
Platelets: 241 10*3/uL (ref 150–450)
RBC: 5.19 x10E6/uL (ref 4.14–5.80)
RDW: 13.3 % (ref 11.6–15.4)
WBC: 7.7 10*3/uL (ref 3.4–10.8)

## 2020-06-20 LAB — COMPREHENSIVE METABOLIC PANEL
ALT: 7 IU/L (ref 0–44)
AST: 20 IU/L (ref 0–40)
Albumin/Globulin Ratio: 2 (ref 1.2–2.2)
Albumin: 4.5 g/dL (ref 3.7–4.7)
Alkaline Phosphatase: 117 IU/L (ref 44–121)
BUN/Creatinine Ratio: 16 (ref 10–24)
BUN: 18 mg/dL (ref 8–27)
Bilirubin Total: 0.7 mg/dL (ref 0.0–1.2)
CO2: 23 mmol/L (ref 20–29)
Calcium: 9.4 mg/dL (ref 8.6–10.2)
Chloride: 101 mmol/L (ref 96–106)
Creatinine, Ser: 1.11 mg/dL (ref 0.76–1.27)
Globulin, Total: 2.3 g/dL (ref 1.5–4.5)
Glucose: 95 mg/dL (ref 65–99)
Potassium: 3.9 mmol/L (ref 3.5–5.2)
Sodium: 141 mmol/L (ref 134–144)
Total Protein: 6.8 g/dL (ref 6.0–8.5)
eGFR: 68 mL/min/{1.73_m2} (ref >59)

## 2020-06-20 LAB — HIV ANTIBODY (ROUTINE TESTING W REFLEX): HIV Screen 4th Generation wRfx: NONREACTIVE

## 2020-06-20 LAB — FOLATE: Folate: >20 ng/mL (ref >3.0)

## 2020-06-20 LAB — RPR: RPR Ser Ql: NONREACTIVE

## 2020-06-20 LAB — TSH: TSH: 0.806 u[IU]/mL (ref 0.450–4.500)

## 2020-06-20 LAB — C-REACTIVE PROTEIN: CRP: 1 mg/L (ref 0–10)

## 2020-06-20 LAB — VITAMIN B12: Vitamin B-12: 859 pg/mL (ref 232–1245)

## 2020-06-20 NOTE — Telephone Encounter (Signed)
[employer] medicare order sent to [facility]. No auth they will reach out to the patient to schedule.  

## 2020-06-20 NOTE — Telephone Encounter (Signed)
Left a detailed message, on daughter's voicemail, with results (ok per DPR).  Provided our number to call back with any questions.

## 2020-06-20 NOTE — Telephone Encounter (Signed)
-----   Message from Levert Feinstein, MD sent at 06/20/2020  2:41 PM EDT ----- Please call patient for normal laboratory result

## 2020-06-28 ENCOUNTER — Telehealth: Payer: Self-pay | Admitting: Neurology

## 2020-06-28 NOTE — Telephone Encounter (Addendum)
His EEG appt has been cancelled.

## 2020-06-28 NOTE — Telephone Encounter (Signed)
I returned the call to the patient's daughter. She wanted to review his recent neurological evaluation. She had questions about the necessity of Depakote, EEG and CT head. She wanted possible diagnosis and potential treatments. She was wary about the side effects of Depakote. She would prefer him not to undergo tests that would not change his treatment (due to his age). She would like a call from Dr. Terrace Arabia for further discussion.

## 2020-06-28 NOTE — Telephone Encounter (Addendum)
I talked with his daughter Lurena Joiner, answered all her related questions, she reported gradual decline of Mr.Zenk functional status over the past few years, word finding difficulties, getting frustrated easily, but there is no clear clinical episode to suggest a seizure,  Lurena Joiner is worried whether he can go through the EEG testing, will cancel the order,  He will try Depakote ER 500 mg, for his agitation, Lurena Joiner will call back if he has improvement,

## 2020-06-28 NOTE — Telephone Encounter (Signed)
Pt's daughter Lurena Joiner on [place] called wanting to discuss the pt's EEG appt. Please advise.

## 2020-07-17 ENCOUNTER — Other Ambulatory Visit: Payer: Medicare Other

## 2021-06-13 ENCOUNTER — Emergency Department (HOSPITAL_COMMUNITY)
Admission: EM | Admit: 2021-06-13 | Discharge: 2021-06-14 | Disposition: A | Discharge status: Home / Self Care | Payer: Medicare Other | Attending: Emergency Medicine | Admitting: Emergency Medicine

## 2021-06-13 ENCOUNTER — Encounter (HOSPITAL_COMMUNITY): Payer: Self-pay | Admitting: Emergency Medicine

## 2021-06-13 ENCOUNTER — Other Ambulatory Visit: Payer: Self-pay

## 2021-06-13 ENCOUNTER — Emergency Department (HOSPITAL_COMMUNITY): Payer: Medicare Other

## 2021-06-13 DIAGNOSIS — W19XXXA Unspecified fall, initial encounter: Secondary | ICD-10-CM | POA: Diagnosis not present

## 2021-06-13 DIAGNOSIS — Y92009 Unspecified place in unspecified non-institutional (private) residence as the place of occurrence of the external cause: Secondary | ICD-10-CM | POA: Diagnosis not present

## 2021-06-13 DIAGNOSIS — R296 Repeated falls: Secondary | ICD-10-CM | POA: Insufficient documentation

## 2021-06-13 DIAGNOSIS — S0990XA Unspecified injury of head, initial encounter: Secondary | ICD-10-CM | POA: Diagnosis not present

## 2021-06-13 NOTE — ED Notes (Signed)
Pt wanded by security at this time  ?

## 2021-06-13 NOTE — ED Triage Notes (Signed)
Pt BIB [facility] post fall x 2; reports mental status at baseline-prior TBI; shoes noted to be on the wrong feet; pt c/o no pain; wife reported to EMS she is unsure if pt hit his head and wants him evaluated ?

## 2021-06-13 NOTE — ED Provider Notes (Signed)
? ?[facility] EMERGENCY DEPARTMENT  ?Provider Note ? ?CSN: [id number] ?Arrival date & time: 06/13/21 2152 ? ?History ?Chief Complaint  ?Patient presents with  ? Fall  ? ? ?Tommy Ross is a 81 y.o. male with prior history of TBI brought to the ED by EMS from home. Daughter at bedside provides much of history. Patient has frequent falls but had two back-to-back today and wife was unable to get him up off the floor. He is usually able to get up on his own so this was unusual. He was noted to have his shoes on the wrong feet at arrival but it is unclear if this may have contributed. He denies any complaints. He is not on blood thinners. He is on sinemet but does not have a formal diagnosis of parkinson's in his chart.  ? ? ?Home Medications ?Prior to Admission medications   ?Medication Sig Start Date End Date Taking? Authorizing Provider  ?acetaminophen (TYLENOL) 650 MG CR tablet Take 650 mg by mouth daily.    [provider]  ?carbidopa-levodopa (SINEMET IR) 10-100 MG tablet Take 1 tablet by mouth in the morning, at noon, and at bedtime. 12/06/19   [provider]  ?co-enzyme Q-10 30 MG capsule Take 1 capsule by mouth daily.    [provider]  ?cyanocobalamin 1000 MCG tablet Take 1 tablet by mouth daily.    [provider]  ?divalproex (DEPAKOTE ER) 500 MG 24 hr tablet Take 1 tablet (500 mg total) by mouth at bedtime. 06/19/20   Levert Feinstein, MD  ?Multiple Vitamin (MULTIVITAMIN) tablet Take 1 tablet by mouth daily.    [provider]  ?UNABLE TO FIND Take 1 tablet by mouth daily. Med Name: Prostate supplement    [provider]  ? ? ? ?Allergies    ?Other and Neosporin [bacitracin-polymyxin b] ? ? ?Review of Systems   ?Review of Systems ?Please see HPI for pertinent positives and negatives ? ?Physical Exam ?BP 137/77   Pulse 64   Temp 97.7 ?F (36.5 ?C) (Oral)   Resp (!) 21   Ht 5\' 11"  (1.803 m)   Wt 104.3 kg   SpO2 97%   BMI 32.08 kg/m?  ? ?Physical Exam ?Vitals  and nursing note reviewed.  ?Constitutional:   ?   Appearance: Normal appearance.  ?HENT:  ?   Head: Normocephalic and atraumatic.  ?   Nose: Nose normal.  ?   Mouth/Throat:  ?   Mouth: Mucous membranes are moist.  ?Eyes:  ?   Extraocular Movements: Extraocular movements intact.  ?   Conjunctiva/sclera: Conjunctivae normal.  ?Cardiovascular:  ?   Rate and Rhythm: Normal rate.  ?Pulmonary:  ?   Effort: Pulmonary effort is normal.  ?   Breath sounds: Normal breath sounds.  ?Abdominal:  ?   General: Abdomen is flat.  ?   Palpations: Abdomen is soft.  ?   Tenderness: There is no abdominal tenderness.  ?Musculoskeletal:     ?   General: No swelling or tenderness. Normal range of motion.  ?   Cervical back: Neck supple.  ?Skin: ?   General: Skin is warm and dry.  ?Neurological:  ?   Mental Status: He is alert. Mental status is at baseline. He is disoriented.  ?   Cranial Nerves: No cranial nerve deficit.  ?   Sensory: No sensory deficit.  ?   Motor: No weakness.  ?Psychiatric:     ?   Mood and Affect: Mood normal.  ? ? ?  ED Results / Procedures / Treatments   ?EKG ?EKG Interpretation ? ?Date/Time:  Wednesday June 13 2021 23:36:18 EDT ?Ventricular Rate:  58 ?PR Interval:  203 ?QRS Duration: 104 ?QT Interval:  420 ?QTC Calculation: 413 ?R Axis:   46 ?Text Interpretation: Sinus rhythm Low voltage, extremity leads No old tracing to compare Confirmed by Susy Frizzle [phone]) on 06/13/2021 11:38:15 PM ? ?Procedures ?Procedures ? ?Medications Ordered in the ED ?Medications  ?LORazepam (ATIVAN) injection 1 mg (1 mg Intramuscular Given 06/14/21 0051)  ? ? ?Initial Impression and Plan ? Patient here with recurrent falls and weakness/unable to get up on his own. Will check basic labs, CT head for possible head injury as daughter is unsure if he hit his head or not.  ? ?ED Course  ? ?Clinical Course as of 06/14/21 0301  ?Wed Jun 13, 2021  ?2338 I personally viewed the images from radiology studies and agree with radiologist  interpretation: CT neg for injury ? [CS]  ?Thu Jun 14, 2021  ?0032 CBC CMP and CK are normal.  [CS]  ?2563 Depakote is borderline subtherapeutic. He has been incontinent of urine and became agitated and aggressive with ED staff and daughter while they were trying to change him. Will give IM Ativan to help with agitation and for staff/family safety while completing his ED workup.  [CS]  ?8937 Patient resting comfortably now with daughter. Discussed normal workup thus far. He has not yet provided a urine specimen but given his incontinence earlier and agitation with changing his pants, I do not feel that we will be able to get a sample safely tonight. I have a low suspicion this is the cause of his falls. She is comfortable taking him home. Recommend outpatient follow up. Probably needs to be managed with neurology as well but she indicated he was not cooperative with that assessment when attempted in the past.  [CS]  ?  ?Clinical Course User Index ?[CS] Pollyann Savoy, MD  ? ? ? ?MDM Rules/Calculators/A&P ?Medical Decision Making ?Given presenting complaint, I considered that admission might be necessary. After review of results from ED lab and/or imaging studies, admission to the hospital is not indicated at this time.  ? ? ?Problems Addressed: ?Fall in home, initial encounter: acute illness or injury ? ?Amount and/or Complexity of Data Reviewed ?Labs: ordered. ?Radiology: ordered. ? ?Risk ?Prescription drug management. ?Decision regarding hospitalization. ? ? ? ?Final Clinical Impression(s) / ED Diagnoses ?Final diagnoses:  ?Fall in home, initial encounter  ? ? ?Rx / DC Orders ?ED Discharge Orders   ? ? None  ? ?  ? ?  ?Pollyann Savoy, MD ?06/14/21 0301 ? ?

## 2021-06-14 ENCOUNTER — Telehealth: Payer: Self-pay | Admitting: Neurology

## 2021-06-14 LAB — COMPREHENSIVE METABOLIC PANEL
ALT: 20 U/L (ref 0–44)
AST: 27 U/L (ref 15–41)
Albumin: 4 g/dL (ref 3.5–5.0)
Alkaline Phosphatase: 80 U/L (ref 38–126)
Anion gap: 12 (ref 5–15)
BUN: 24 mg/dL — ABNORMAL HIGH (ref 8–23)
CO2: 26 mmol/L (ref 22–32)
Calcium: 8.9 mg/dL (ref 8.9–10.3)
Chloride: 105 mmol/L (ref 98–111)
Creatinine, Ser: 1.09 mg/dL (ref 0.61–1.24)
GFR, Estimated: >60 mL/min (ref >60)
Glucose, Bld: 98 mg/dL (ref 70–99)
Potassium: 3.3 mmol/L — ABNORMAL LOW (ref 3.5–5.1)
Sodium: 143 mmol/L (ref 135–145)
Total Bilirubin: 1.1 mg/dL (ref 0.3–1.2)
Total Protein: 6.6 g/dL (ref 6.5–8.1)

## 2021-06-14 LAB — CBC WITH DIFFERENTIAL/PLATELET
Abs Immature Granulocytes: 0.02 10*3/uL (ref 0.00–0.07)
Basophils Absolute: 0 10*3/uL (ref 0.0–0.1)
Basophils Relative: 0 %
Eosinophils Absolute: 0.2 10*3/uL (ref 0.0–0.5)
Eosinophils Relative: 2 %
HCT: 43.4 % (ref 39.0–52.0)
Hemoglobin: 14.1 g/dL (ref 13.0–17.0)
Immature Granulocytes: 0 %
Lymphocytes Relative: 20 %
Lymphs Abs: 1.8 10*3/uL (ref 0.7–4.0)
MCH: 28.7 pg (ref 26.0–34.0)
MCHC: 32.5 g/dL (ref 30.0–36.0)
MCV: 88.4 fL (ref 80.0–100.0)
Monocytes Absolute: 1.3 10*3/uL — ABNORMAL HIGH (ref 0.1–1.0)
Monocytes Relative: 15 %
Neutro Abs: 5.5 10*3/uL (ref 1.7–7.7)
Neutrophils Relative %: 63 %
Platelets: 210 10*3/uL (ref 150–400)
RBC: 4.91 MIL/uL (ref 4.22–5.81)
RDW: 13.7 % (ref 11.5–15.5)
WBC: 8.8 10*3/uL (ref 4.0–10.5)
nRBC: 0 % (ref 0.0–0.2)

## 2021-06-14 LAB — CK: Total CK: 133 U/L (ref 49–397)

## 2021-06-14 LAB — VALPROIC ACID LEVEL: Valproic Acid Lvl: 46 ug/mL — ABNORMAL LOW (ref 50.0–100.0)

## 2021-06-14 MED ORDER — LORAZEPAM 2 MG/ML IJ SOLN
1.0000 mg | Freq: Once | INTRAMUSCULAR | Status: AC
  Administered 2021-06-14: 1 mg via INTRAMUSCULAR
  Filled 2021-06-14: qty 1

## 2021-06-14 NOTE — ED Notes (Signed)
Pt had urinated on self. Staff attempted to change pt but pt became very agitated. Dr. Bernette Mayers at bedside. New orders given. Will continue to monitor. ?

## 2021-06-14 NOTE — Telephone Encounter (Signed)
[place] Loma Sousa, Nurse Navigator) Pt's wife request a male physician due to patient cognitive. Pt agitated with a male. Pt seen yesterday in ER for a fall. Pt was not admitted. Want to know if he can be worked in today. ?

## 2021-06-20 ENCOUNTER — Emergency Department (HOSPITAL_COMMUNITY): Payer: Medicare Other

## 2021-06-20 ENCOUNTER — Other Ambulatory Visit: Payer: Self-pay

## 2021-06-20 ENCOUNTER — Emergency Department (HOSPITAL_COMMUNITY)
Admission: EM | Admit: 2021-06-20 | Discharge: 2021-06-20 | Disposition: A | Discharge status: Home / Self Care | Payer: Medicare Other | Attending: Emergency Medicine | Admitting: Emergency Medicine

## 2021-06-20 ENCOUNTER — Encounter (HOSPITAL_COMMUNITY): Payer: Self-pay

## 2021-06-20 DIAGNOSIS — F0390 Unspecified dementia without behavioral disturbance: Secondary | ICD-10-CM | POA: Insufficient documentation

## 2021-06-20 DIAGNOSIS — R350 Frequency of micturition: Secondary | ICD-10-CM | POA: Diagnosis present

## 2021-06-20 DIAGNOSIS — R1032 Left lower quadrant pain: Secondary | ICD-10-CM | POA: Diagnosis not present

## 2021-06-20 DIAGNOSIS — N410 Acute prostatitis: Secondary | ICD-10-CM | POA: Diagnosis not present

## 2021-06-20 DIAGNOSIS — I509 Heart failure, unspecified: Secondary | ICD-10-CM | POA: Insufficient documentation

## 2021-06-20 DIAGNOSIS — D72829 Elevated white blood cell count, unspecified: Secondary | ICD-10-CM | POA: Insufficient documentation

## 2021-06-20 DIAGNOSIS — R001 Bradycardia, unspecified: Secondary | ICD-10-CM | POA: Insufficient documentation

## 2021-06-20 DIAGNOSIS — Z20822 Contact with and (suspected) exposure to covid-19: Secondary | ICD-10-CM | POA: Insufficient documentation

## 2021-06-20 LAB — CBC WITH DIFFERENTIAL/PLATELET
Abs Immature Granulocytes: 0.06 10*3/uL (ref 0.00–0.07)
Basophils Absolute: 0 10*3/uL (ref 0.0–0.1)
Basophils Relative: 0 %
Eosinophils Absolute: 0 10*3/uL (ref 0.0–0.5)
Eosinophils Relative: 0 %
HCT: 49.8 % (ref 39.0–52.0)
Hemoglobin: 16.1 g/dL (ref 13.0–17.0)
Immature Granulocytes: 0 %
Lymphocytes Relative: 10 %
Lymphs Abs: 1.4 10*3/uL (ref 0.7–4.0)
MCH: 28.8 pg (ref 26.0–34.0)
MCHC: 32.3 g/dL (ref 30.0–36.0)
MCV: 89.1 fL (ref 80.0–100.0)
Monocytes Absolute: 1.7 10*3/uL — ABNORMAL HIGH (ref 0.1–1.0)
Monocytes Relative: 13 %
Neutro Abs: 10.3 10*3/uL — ABNORMAL HIGH (ref 1.7–7.7)
Neutrophils Relative %: 77 %
Platelets: 162 10*3/uL (ref 150–400)
RBC: 5.59 MIL/uL (ref 4.22–5.81)
RDW: 14.1 % (ref 11.5–15.5)
WBC: 13.5 10*3/uL — ABNORMAL HIGH (ref 4.0–10.5)
nRBC: 0 % (ref 0.0–0.2)

## 2021-06-20 LAB — COMPREHENSIVE METABOLIC PANEL
ALT: 33 U/L (ref 0–44)
AST: 26 U/L (ref 15–41)
Albumin: 4.1 g/dL (ref 3.5–5.0)
Alkaline Phosphatase: 86 U/L (ref 38–126)
Anion gap: 9 (ref 5–15)
BUN: 16 mg/dL (ref 8–23)
CO2: 28 mmol/L (ref 22–32)
Calcium: 8.9 mg/dL (ref 8.9–10.3)
Chloride: 103 mmol/L (ref 98–111)
Creatinine, Ser: 0.96 mg/dL (ref 0.61–1.24)
GFR, Estimated: >60 mL/min (ref >60)
Glucose, Bld: 128 mg/dL — ABNORMAL HIGH (ref 70–99)
Potassium: 3.8 mmol/L (ref 3.5–5.1)
Sodium: 140 mmol/L (ref 135–145)
Total Bilirubin: 1.1 mg/dL (ref 0.3–1.2)
Total Protein: 7.2 g/dL (ref 6.5–8.1)

## 2021-06-20 LAB — URINALYSIS, ROUTINE W REFLEX MICROSCOPIC
Bacteria, UA: NONE SEEN
Bilirubin Urine: NEGATIVE
Glucose, UA: NEGATIVE mg/dL
Hgb urine dipstick: NEGATIVE
Ketones, ur: 20 mg/dL — AB
Leukocytes,Ua: NEGATIVE
Nitrite: NEGATIVE
Protein, ur: 30 mg/dL — AB
Specific Gravity, Urine: 1.017 (ref 1.005–1.030)
pH: 6 (ref 5.0–8.0)

## 2021-06-20 LAB — RESP PANEL BY RT-PCR (FLU A&B, COVID) ARPGX2
Influenza A by PCR: NEGATIVE
Influenza B by PCR: NEGATIVE
SARS Coronavirus 2 by RT PCR: NEGATIVE

## 2021-06-20 MED ORDER — DOXYCYCLINE HYCLATE 100 MG PO CAPS: 100.0000 mg | ORAL_CAPSULE | Freq: Two times a day (BID) | ORAL | 0 refills | Status: AC

## 2021-06-20 MED ORDER — IOHEXOL 300 MG/ML  SOLN
100.0000 mL | Freq: Once | INTRAMUSCULAR | Status: AC | PRN
  Administered 2021-06-20: 100 mL via INTRAVENOUS

## 2021-06-20 NOTE — ED Provider Notes (Signed)
. ?[facility] ?Provider Note ? ? ?CSN: [id number] ?Arrival date & time: 06/20/21  1105 ? ?  ? ?History ? ?Chief Complaint  ?Patient presents with  ? Urinary Frequency  ? ? ?Tommy Ross is a 81 y.o. male.  With past medical history of dementia, congestive heart failure who presents emergency department with reported urinary frequency. ? ?Level 5 caveat: Altered mental status/dementia ? ?Presents with wife who provides the history.  States that patient was acting abnormal this morning.  She states that she went into his room and he was laying in bed staring away from her.  She states that it took her multiple attempts to get him to speak.  She states that he has more altered from his baseline.  She states that she watched him go to the bathroom this morning and noticed that he had odor to his urine.  States that he seems to have abdominal pain but has not verbalized this. States that he has had decreased p.o. intake over the past 1 week.  Also had a fall last week while outside and was unable to get up on his own, which she states he is normally able to do.  She denies any cough or known fevers at home. ? ? ?Urinary Frequency ? ? ?  ? ?Home Medications ?Prior to Admission medications   ?Medication Sig Start Date End Date Taking? Authorizing Provider  ?acetaminophen (TYLENOL) 650 MG CR tablet Take 650 mg by mouth daily.    [provider]  ?carbidopa-levodopa (SINEMET IR) 10-100 MG tablet Take 1 tablet by mouth in the morning, at noon, and at bedtime. 12/06/19   [provider]  ?co-enzyme Q-10 30 MG capsule Take 1 capsule by mouth daily.    [provider]  ?cyanocobalamin 1000 MCG tablet Take 1 tablet by mouth daily.    [provider]  ?divalproex (DEPAKOTE ER) 500 MG 24 hr tablet Take 1 tablet (500 mg total) by mouth at bedtime. 06/19/20   Levert FeinsteinYan, Yijun, MD  ?Multiple Vitamin (MULTIVITAMIN) tablet Take 1 tablet by mouth daily.    [provider]  ?UNABLE  TO FIND Take 1 tablet by mouth daily. Med Name: Prostate supplement    [provider]  ?   ? ?Allergies    ?Other and Neosporin [bacitracin-polymyxin b]   ? ?Review of Systems   ?Review of Systems  ?Unable to perform ROS: Mental status change  ?Genitourinary:  Positive for frequency.  ? ?Physical Exam ?Updated Vital Signs ?BP (!) 150/76   Pulse (!) 59   Temp 98.5 ?F (36.9 ?C)   Resp (!) 25   Ht 5\' 11"  (1.803 m)   Wt 103 kg   SpO2 98%   BMI 31.67 kg/m?  ?Physical Exam ?Vitals and nursing note reviewed.  ?Constitutional:   ?   General: He is not in acute distress. ?   Appearance: Normal appearance. He is normal weight. He is ill-appearing. He is not toxic-appearing.  ?HENT:  ?   Head: Normocephalic and atraumatic.  ?   Nose: Nose normal.  ?   Mouth/Throat:  ?   Mouth: Mucous membranes are dry.  ?   Pharynx: Oropharynx is clear.  ?Eyes:  ?   General: No scleral icterus. ?   Extraocular Movements: Extraocular movements intact.  ?   Conjunctiva/sclera: Conjunctivae normal.  ?   Pupils: Pupils are equal, round, and reactive to light.  ?Cardiovascular:  ?   Rate and Rhythm: Regular rhythm. Bradycardia  present.  ?   Pulses: Normal pulses.  ?   Heart sounds: Normal heart sounds. No murmur heard. ?Pulmonary:  ?   Effort: Pulmonary effort is normal. No respiratory distress.  ?   Breath sounds: Examination of the right-lower field reveals decreased breath sounds. Examination of the left-lower field reveals decreased breath sounds. Decreased breath sounds present.  ?Abdominal:  ?   General: Abdomen is flat. Bowel sounds are normal. There is no distension.  ?   Palpations: Abdomen is soft.  ?   Tenderness: There is abdominal tenderness in the suprapubic area and left lower quadrant.  ?   Comments: Guards abdomen when attempting to palpate but cannot otherwise verbalize if he is in pain  ?Musculoskeletal:     ?   General: Normal range of motion.  ?   Cervical back: Neck supple. No tenderness.  ?Skin: ?   General:  Skin is warm and dry.  ?   Capillary Refill: Capillary refill takes less than 2 seconds.  ?   Comments: Right second toe is red and slightly swollen.  No surrounding cellulitis of the foot.  No purulent drainage concerning for abscess.  ?Neurological:  ?   General: No focal deficit present.  ?   Mental Status: He is alert. He is confused.  ?   GCS: GCS eye subscore is 4. GCS verbal subscore is 4. GCS motor subscore is 6.  ?   Cranial Nerves: No cranial nerve deficit or facial asymmetry.  ? ? ?ED Results / Procedures / Treatments   ?Labs ?(all labs ordered are listed, but only abnormal results are displayed) ?Labs Reviewed  ?COMPREHENSIVE METABOLIC PANEL - Abnormal; Notable for the following components:  ?    Result Value  ? Glucose, Bld 128 (*)   ? All other components within normal limits  ?CBC WITH DIFFERENTIAL/PLATELET - Abnormal; Notable for the following components:  ? WBC 13.5 (*)   ? Neutro Abs 10.3 (*)   ? Monocytes Absolute 1.7 (*)   ? All other components within normal limits  ?URINALYSIS, ROUTINE W REFLEX MICROSCOPIC - Abnormal; Notable for the following components:  ? Ketones, ur 20 (*)   ? Protein, ur 30 (*)   ? All other components within normal limits  ?RESP PANEL BY RT-PCR (FLU A&B, COVID) ARPGX2  ?URINE CULTURE  ? ?EKG ?EKG Interpretation ? ?Date/Time:  Wednesday June 20 2021 13:27:59 EDT ?Ventricular Rate:  63 ?PR Interval:  205 ?QRS Duration: 95 ?QT Interval:  401 ?QTC Calculation: 411 ?R Axis:   49 ?Text Interpretation: Sinus rhythm Borderline low voltage, extremity leads Confirmed by Kommor, [place] [phone]) on 06/20/2021 3:38:45 PM ? ?Radiology ?DG Chest 2 View ? ?Result Date: 06/20/2021 ?CLINICAL DATA:  Rule out pneumonia EXAM: CHEST - 2 VIEW COMPARISON:  CT chest 03/04/2007 FINDINGS: Mild left lower lobe airspace disease. No effusion. Right lung is clear. Negative for heart failure or edema. IMPRESSION: Mild left lower lobe airspace disease likely atelectasis. Electronically Signed   By: Marlan Palau M.D.   On: 06/20/2021 13:49  ? ?CT Head Wo Contrast ? ?Result Date: 06/20/2021 ?CLINICAL DATA:  Altered mental status EXAM: CT HEAD WITHOUT CONTRAST TECHNIQUE: Contiguous axial images were obtained from the base of the skull through the vertex without intravenous contrast. RADIATION DOSE REDUCTION: This exam was performed according to the departmental dose-optimization program which includes automated exposure control, adjustment of the mA and/or kV according to patient size and/or use of iterative reconstruction technique. COMPARISON:  06/13/2021  FINDINGS: Brain: No evidence of acute infarction, hemorrhage, hydrocephalus, extra-axial collection or mass lesion/mass effect. Periventricular and deep white matter hypodensity. Vascular: No hyperdense vessel or unexpected calcification. Skull: Normal. Negative for fracture or focal lesion. Sinuses/Orbits: No acute finding. Chronic postoperative findings of the frontal sinuses (series 3, image 42). Other: None. IMPRESSION: No acute intracranial pathology. Small-vessel white matter disease. Electronically Signed   By: Jearld Lesch M.D.   On: 06/20/2021 13:43  ? ?CT Abdomen Pelvis W Contrast ? ?Result Date: 06/20/2021 ?CLINICAL DATA:  Incontinent today with foul-smelling odor, increased urinary frequency, abdominal pain, history dementia, CHF EXAM: CT ABDOMEN AND PELVIS WITH CONTRAST TECHNIQUE: Multidetector CT imaging of the abdomen and pelvis was performed using the standard protocol following bolus administration of intravenous contrast. RADIATION DOSE REDUCTION: This exam was performed according to the departmental dose-optimization program which includes automated exposure control, adjustment of the mA and/or kV according to patient size and/or use of iterative reconstruction technique. CONTRAST:  OMNIPAQUE IOHEXOL 300 MG/ML SOLN IV. No oral contrast. COMPARISON:  None FINDINGS: Lower chest: Mild LEFT basilar atelectasis Hepatobiliary: Gallbladder and liver  normal appearance Pancreas: Atrophic pancreas without mass Spleen: Calcified splenic granulomata without mass Adrenals/Urinary Tract: Adrenal thickening without discrete nodule. Tiny nonobstructing LEFT rena

## 2021-06-20 NOTE — ED Notes (Signed)
Patient transported to CT 

## 2021-06-20 NOTE — ED Notes (Signed)
In/out 80F cath with approximately of urine returned.  ?

## 2021-06-20 NOTE — Discharge Instructions (Signed)
You are seen in the emergency department today for foul-smelling urine and abdominal pain.  It may be that you have a condition called prostatitis which is inflammation of the prostate.  We are placing you on antibiotics that you will take twice a day over the next 14 days.  Please return to the emergency department for worsening abdominal pain with fevers, decreased urine output, not eating or drinking, worsening shortness of breath with cough and fever, or any other concerns.  Otherwise please follow with primary care provider. ?

## 2021-06-20 NOTE — ED Triage Notes (Signed)
Per wife patient has hx of dementia and never complains of pain. Per wife patient is incontinent today with foul smelling odor.   ?

## 2021-06-21 LAB — URINE CULTURE: Culture: NO GROWTH

## 2021-07-23 DEATH — deceased

## 2023-11-29 IMAGING — CT CT HEAD W/O CM
4 series · 16 of 47 positions shown, 18 images · non-contrast
Comparison: 06/13/2021

CLINICAL DATA: Altered mental status



[Series 2: head w o · axial · 0.44mm/px · z∈[+68,+193]mm · 7 of 35 slices shown, 9 images]
[im 5/35  brain]
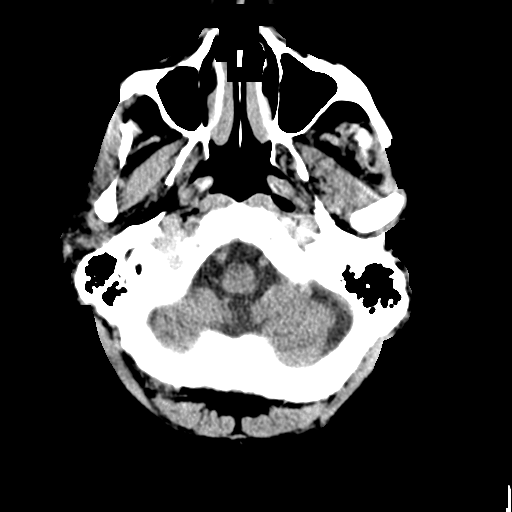
[im 5/35  bone]
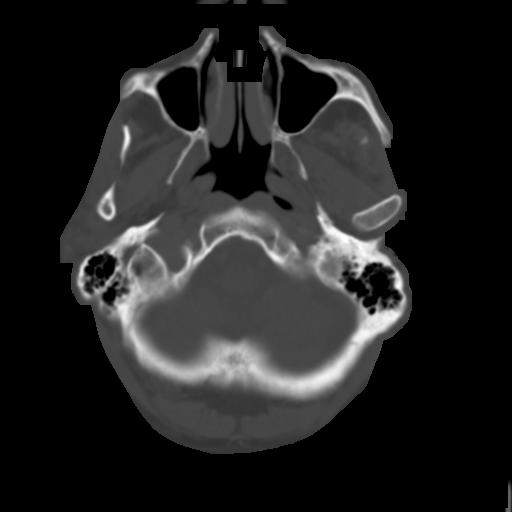
[im 9/35  brain]
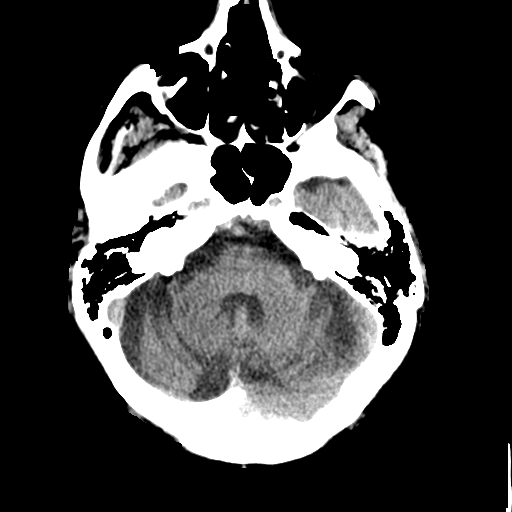
[im 13/35  brain]
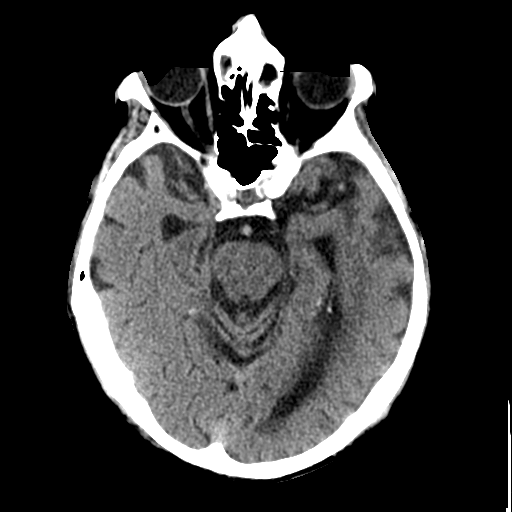
[im 18/35  brain]
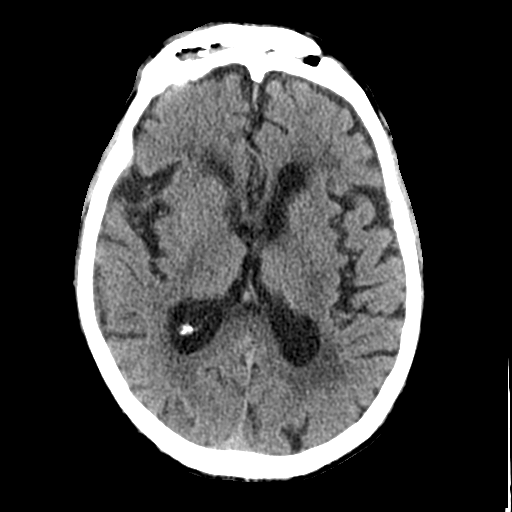
[im 22/35  brain]
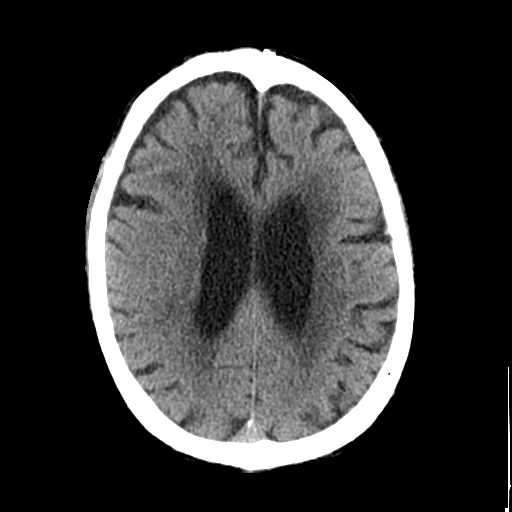
[im 22/35  bone]
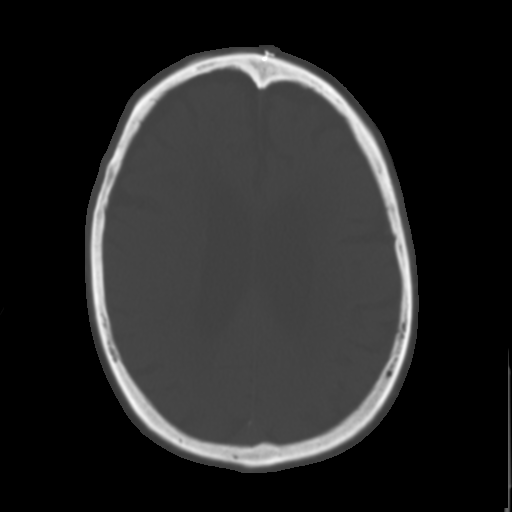
[im 26/35  brain]
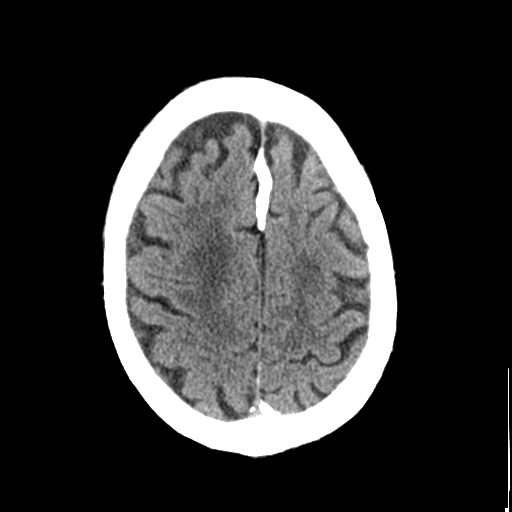
[im 30/35  brain]
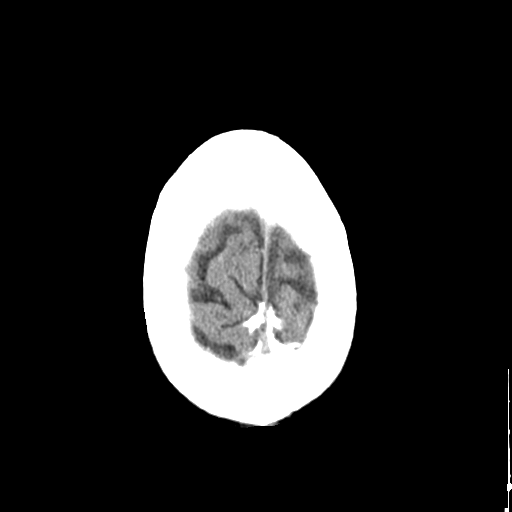

[Series 3: head bone · axial · 0.44mm/px · z∈[+64,+98]mm · 3 of 86 slices shown]
[im 9/86  bone]
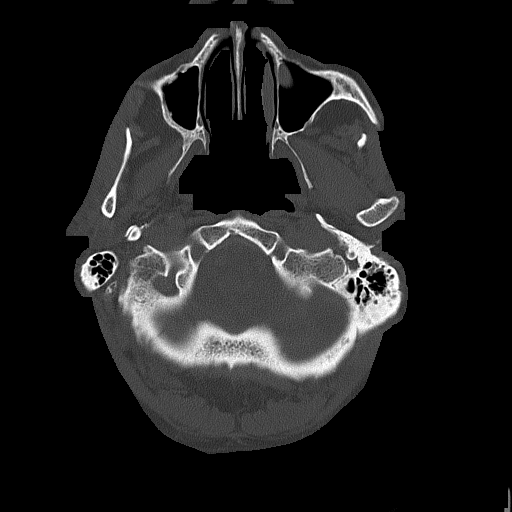
[im 18/86  bone]
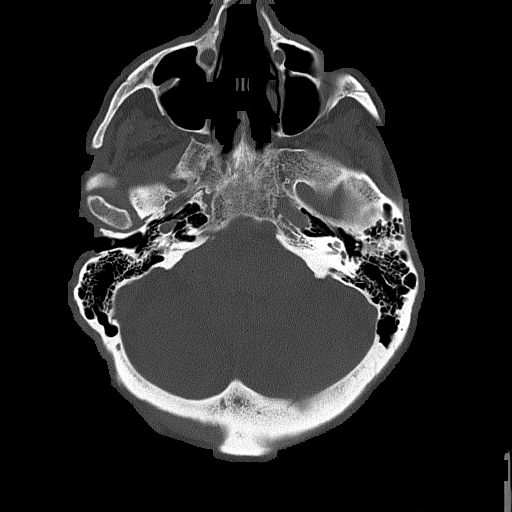
[im 26/86  bone]
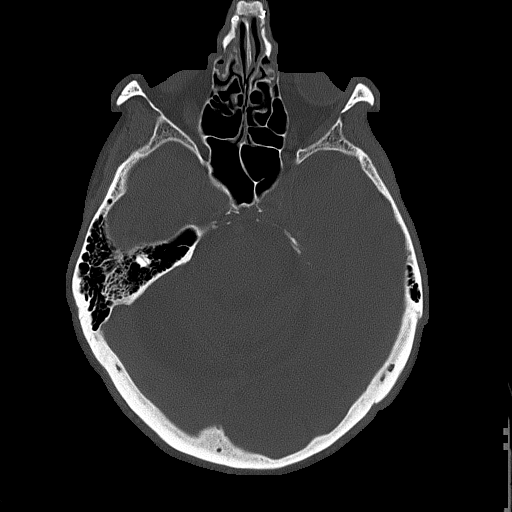

[Series 4: coronal soft · coronal · 0.36mm/px · 3 of 84 slices shown]
[im 28/84  brain]
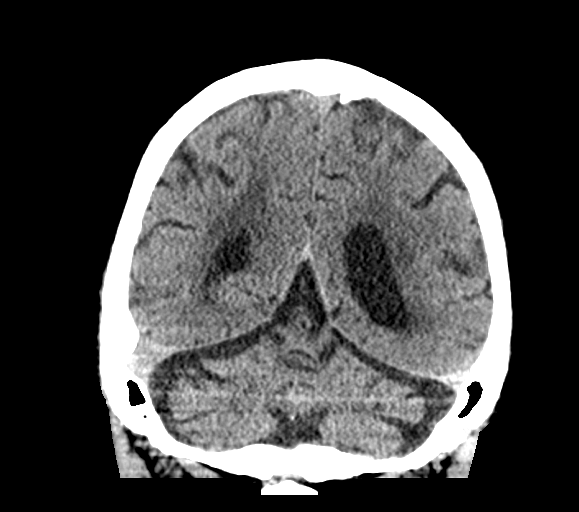
[im 37/84  brain]
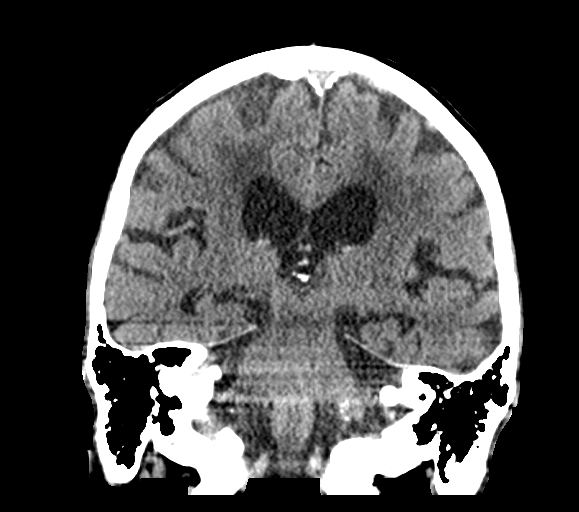
[im 47/84  brain]
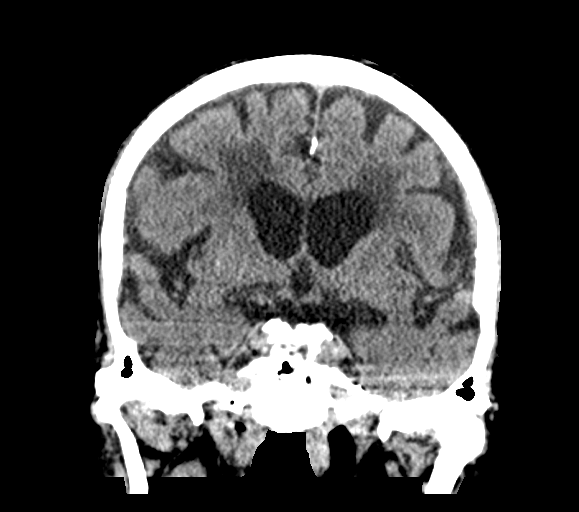

[Series 5: sagittal soft · sagittal · 0.34mm/px · 3 of 61 slices shown]
[im 21/61  brain]
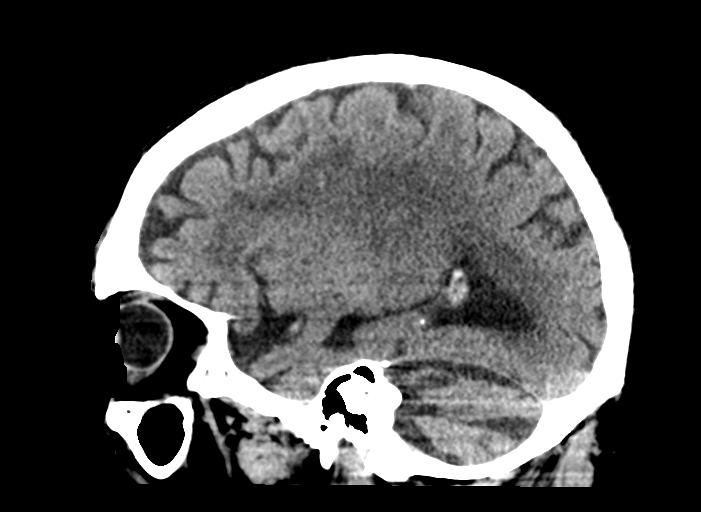
[im 31/61  brain]
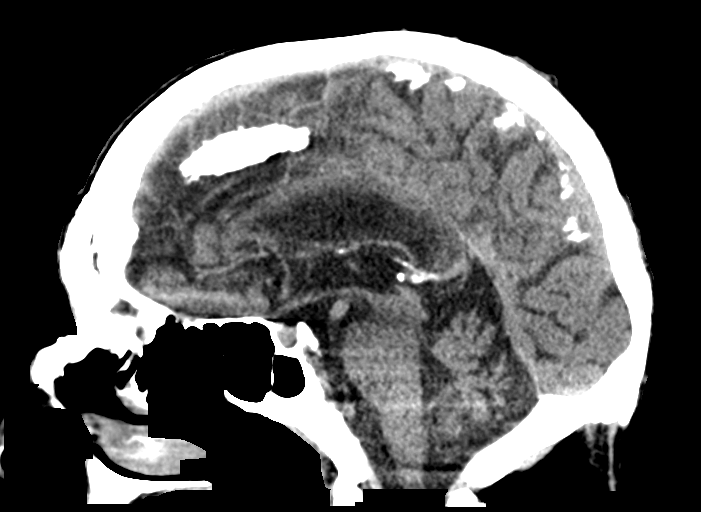
[im 41/61  brain]
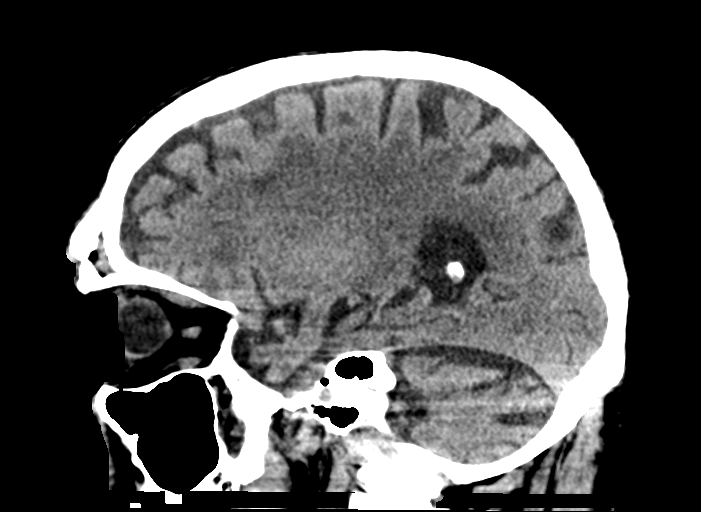

[16 of 47 positions shown; findings below may reference images not displayed]

FINDINGS: Brain: No evidence of acute infarction, hemorrhage, hydrocephalus,
extra-axial collection or mass lesion/mass effect. Periventricular
and deep white matter hypodensity.

Vascular: No hyperdense vessel or unexpected calcification.

Skull: Normal. Negative for fracture or focal lesion.

Sinuses/Orbits: No acute finding. Chronic postoperative findings of
the frontal sinuses (series 3, image 42).

Other: None.
IMPRESSION: No acute intracranial pathology. Small-vessel white matter disease.

## 2023-11-29 IMAGING — DX DG CHEST 2V
2 series · 2 of 2 positions shown · non-contrast
Comparison: CT chest 03/04/2007

CLINICAL DATA: Rule out pneumonia

EXAM:
CHEST - 2 VIEW

[chest lat]
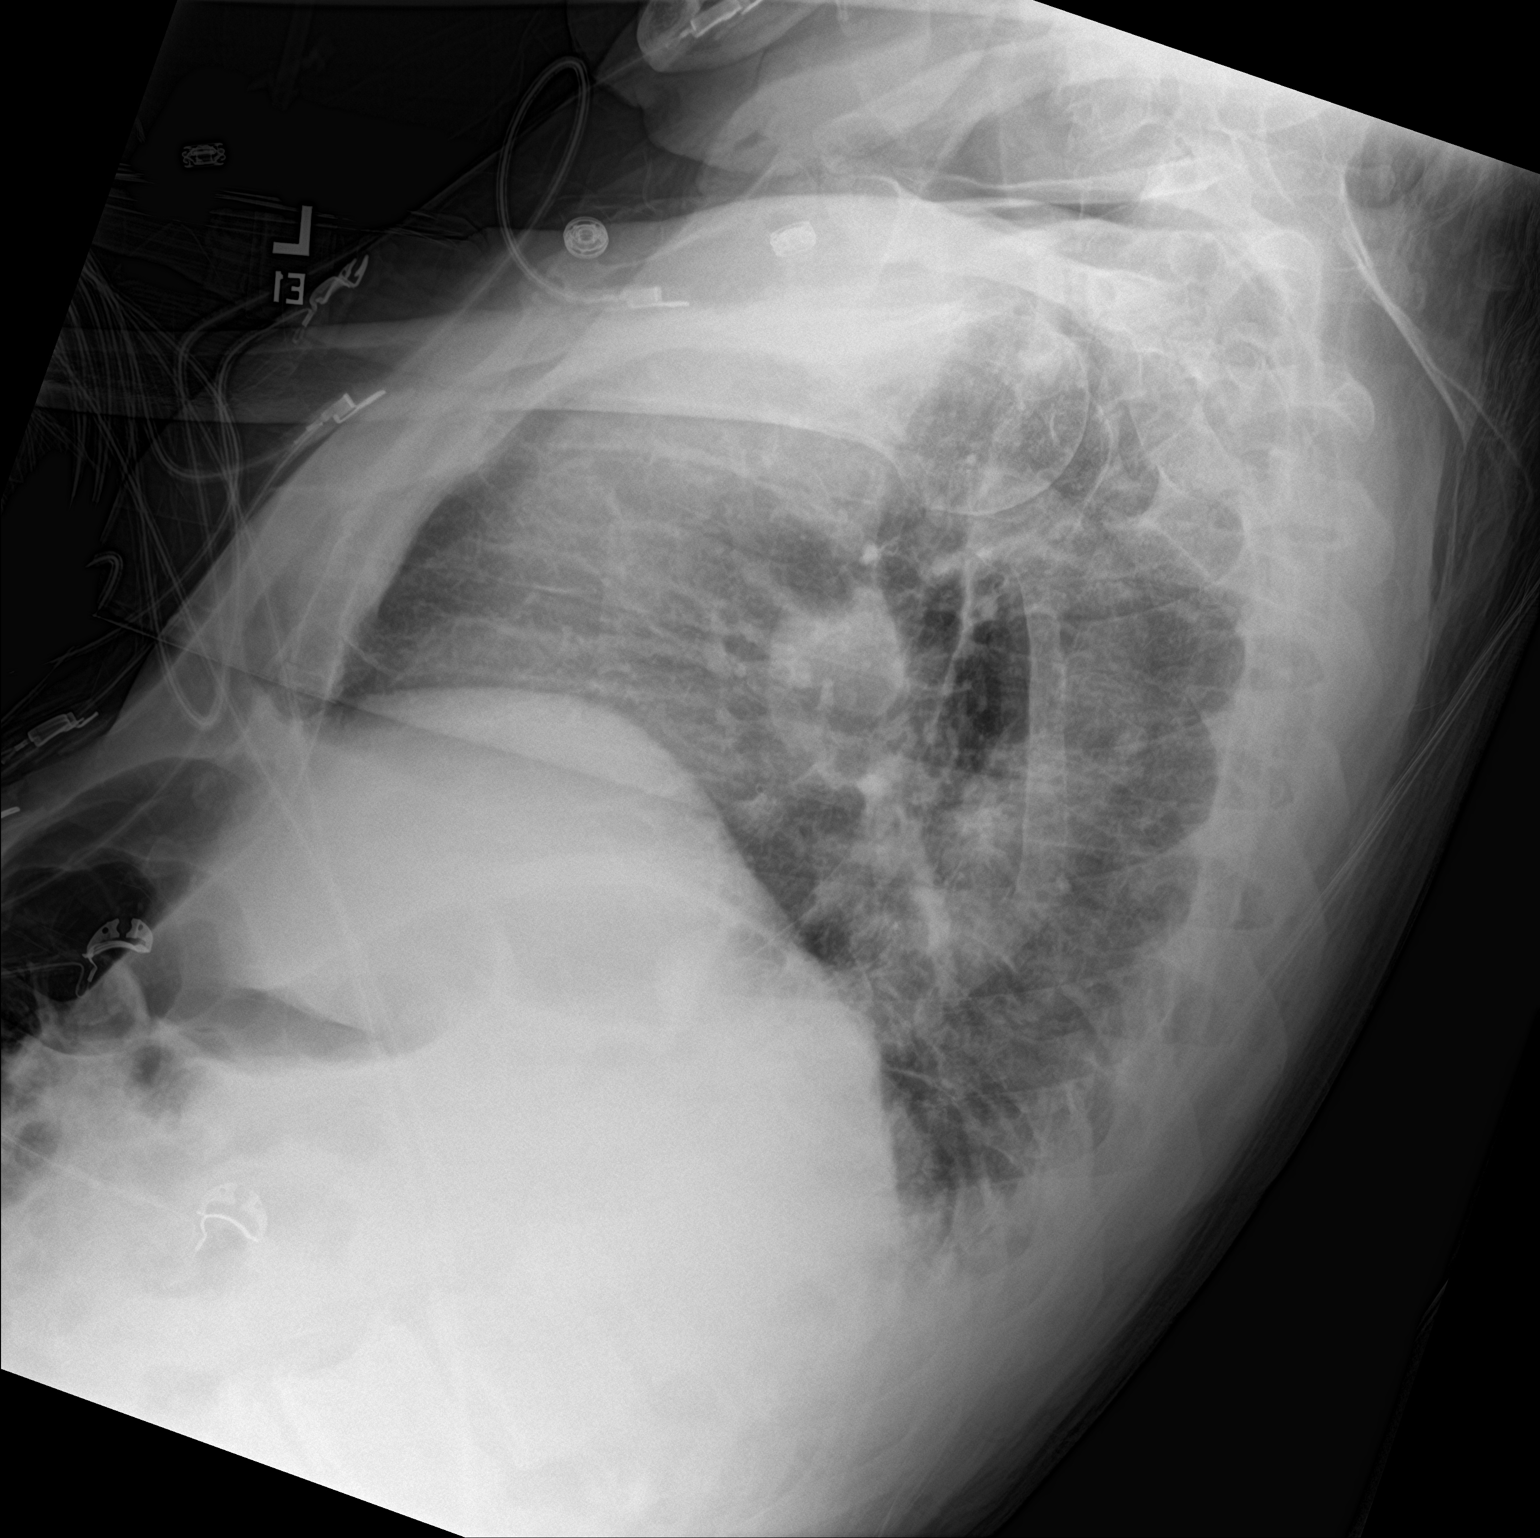

[chest ap]
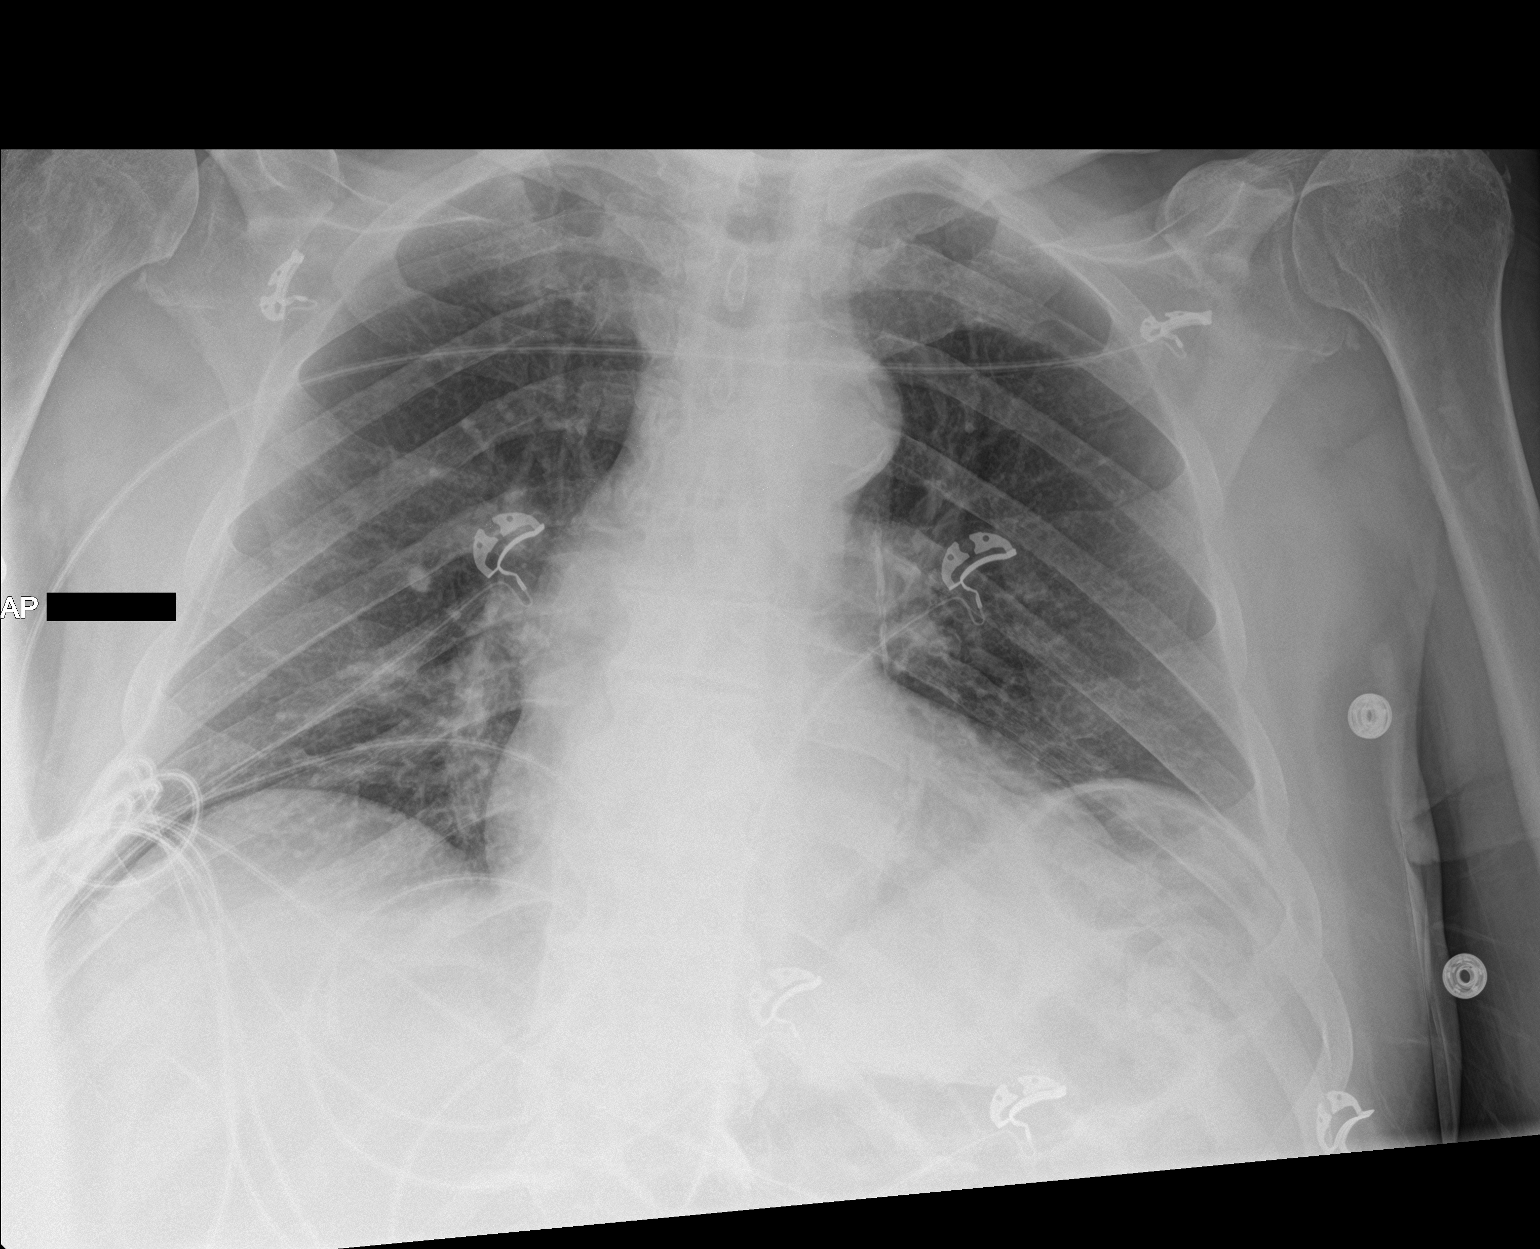

[2 of 2 positions shown; findings below may reference images not displayed]

FINDINGS: Mild left lower lobe airspace disease. No effusion. Right lung is
clear. Negative for heart failure or edema.
IMPRESSION: Mild left lower lobe airspace disease likely atelectasis.
